# Patient Record
Sex: Female | Born: 1995 | Race: White | Hispanic: No | Smoking: Current some day smoker
Health system: Southern US, Community
[De-identification: ages and names within clinical notes are randomized; demographics above are authoritative.]

## PROBLEM LIST (undated history)

## (undated) ENCOUNTER — Inpatient Hospital Stay: Payer: Self-pay

## (undated) DIAGNOSIS — F419 Anxiety disorder, unspecified: Secondary | ICD-10-CM

## (undated) DIAGNOSIS — K8020 Calculus of gallbladder without cholecystitis without obstruction: Secondary | ICD-10-CM

## (undated) DIAGNOSIS — F32A Depression, unspecified: Secondary | ICD-10-CM

## (undated) HISTORY — PX: INDUCED ABORTION: SHX677

## (undated) HISTORY — PX: ABDOMINAL SURGERY: SHX537

---
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
## 2014-03-18 ENCOUNTER — Ambulatory Visit (HOSPITAL_COMMUNITY): Payer: Self-pay | Admitting: Psychology

## 2015-02-24 HISTORY — PX: INDUCED ABORTION: SHX677

## 2015-08-13 ENCOUNTER — Emergency Department
Admission: EM | Admit: 2015-08-13 | Discharge: 2015-08-13 | Disposition: A | Discharge status: Home / Self Care | Payer: BLUE CROSS/BLUE SHIELD | Attending: Emergency Medicine | Admitting: Emergency Medicine

## 2015-08-13 ENCOUNTER — Emergency Department: Payer: BLUE CROSS/BLUE SHIELD

## 2015-08-13 DIAGNOSIS — O9989 Other specified diseases and conditions complicating pregnancy, childbirth and the puerperium: Secondary | ICD-10-CM | POA: Diagnosis not present

## 2015-08-13 DIAGNOSIS — R102 Pelvic and perineal pain: Secondary | ICD-10-CM | POA: Insufficient documentation

## 2015-08-13 DIAGNOSIS — R350 Frequency of micturition: Secondary | ICD-10-CM | POA: Diagnosis not present

## 2015-08-13 DIAGNOSIS — F17210 Nicotine dependence, cigarettes, uncomplicated: Secondary | ICD-10-CM | POA: Diagnosis not present

## 2015-08-13 DIAGNOSIS — K8020 Calculus of gallbladder without cholecystitis without obstruction: Secondary | ICD-10-CM | POA: Insufficient documentation

## 2015-08-13 DIAGNOSIS — O99331 Smoking (tobacco) complicating pregnancy, first trimester: Secondary | ICD-10-CM | POA: Diagnosis not present

## 2015-08-13 DIAGNOSIS — Z3A01 Less than 8 weeks gestation of pregnancy: Secondary | ICD-10-CM | POA: Diagnosis not present

## 2015-08-13 DIAGNOSIS — Z3490 Encounter for supervision of normal pregnancy, unspecified, unspecified trimester: Secondary | ICD-10-CM

## 2015-08-13 HISTORY — DX: Calculus of gallbladder without cholecystitis without obstruction: K80.20

## 2015-08-13 LAB — POCT PREGNANCY, URINE: Preg Test, Ur: POSITIVE — AB

## 2015-08-13 LAB — URINALYSIS COMPLETE WITH MICROSCOPIC (ARMC ONLY)
Bilirubin Urine: NEGATIVE
Glucose, UA: NEGATIVE mg/dL
Hgb urine dipstick: NEGATIVE
Nitrite: NEGATIVE
Protein, ur: NEGATIVE mg/dL
Specific Gravity, Urine: 1.028 (ref 1.005–1.030)
pH: 5 (ref 5.0–8.0)

## 2015-08-13 LAB — CHLAMYDIA/NGC RT PCR (ARMC ONLY)
Chlamydia Tr: NOT DETECTED
N gonorrhoeae: NOT DETECTED

## 2015-08-13 LAB — WET PREP, GENITAL
Clue Cells Wet Prep HPF POC: NONE SEEN
Trich, Wet Prep: NONE SEEN

## 2015-08-13 LAB — HCG, QUANTITATIVE, PREGNANCY: hCG, Beta Chain, Quant, S: 11746 m[IU]/mL — ABNORMAL HIGH (ref <5)

## 2015-08-13 NOTE — ED Notes (Signed)
Patient taken to ultrasound.

## 2015-08-13 NOTE — ED Notes (Signed)
Pt found out 1week ago that she is pregnant by test at home, she had an abortion in march and states she was supposed to have follow up D&C "to check" that there was no retained products.  Pt never had follow up and is no concerned..  She states "I am having symptoms that are not normal" but she is not able to give specific complaints.  States has had panic attack today and is anxious in triage.

## 2015-08-13 NOTE — ED Provider Notes (Signed)
[facility] Emergency Department Provider Note  ____________________________________________  Time seen: 5:30 AM  I have reviewed the triage vital signs and the nursing notes.   HISTORY  Chief Complaint Possible Pregnancy      HPI Anne Rogers is a 19 y.o. female G1 para 0 with 1 elective abortion March 2016 presents with history of positive home pregnancy test approximately 1-2 weeks ago. Patient statesthat she's had multiple symptoms which concern her namely breast tenderness, eating a lot, pelvic discomfort, urinary frequency. Last menstrual period July 6     Past medical history Elective abortion March 2016 Patient Active Problem List   Diagnosis Date Noted  . Gall stones     Surgical history None Current Outpatient Rx  Name  Route  Sig  Dispense  Refill  . aspirin-acetaminophen-caffeine (EXCEDRIN MIGRAINE) 250-250-65 MG per tablet   Oral   Take by mouth every 6 (six) hours as needed for headache.         Marland Kitchen aspirin-sod bicarb-citric acid (ALKA-SELTZER) 325 MG TBEF tablet   Oral   Take 325 mg by mouth every 6 (six) hours as needed.           Allergies No known drug allergies History reviewed. No pertinent family history.  Social History Social History  Substance Use Topics  . Smoking status: Current Every Day Smoker -- 0.50 packs/day for 1.5 years    Types: Cigarettes  . Smokeless tobacco: None  . Alcohol Use: No    Review of Systems  Constitutional: Negative for fever. Eyes: Negative for visual changes. ENT: Negative for sore throat. Cardiovascular: Negative for chest pain. Respiratory: Negative for shortness of breath. Gastrointestinal: Negative for abdominal pain, vomiting and diarrhea. Genitourinary: Negative for dysuria. Musculoskeletal: Negative for back pain. Skin: Negative for rash. Neurological: Negative for headaches, focal weakness or numbness.   10-point ROS otherwise  negative.  ____________________________________________   PHYSICAL EXAM:  VITAL SIGNS: ED Triage Vitals  Enc Vitals Group     BP 08/13/15 0459 138/62 mmHg     Pulse Rate 08/13/15 0459 68     Resp 08/13/15 0459 18     Temp 08/13/15 0459 98.3 F (36.8 C)     Temp Source 08/13/15 0459 Oral     SpO2 08/13/15 0459 100 %     Weight 08/13/15 0459 240 lb (108.863 kg)     Height 08/13/15 0459 5\' 3"  (1.6 m)     Head Cir --      Peak Flow --      Pain Score 08/13/15 0500 0     Pain Loc --      Pain Edu? --      Excl. in GC? --      Constitutional: Alert and oriented. Well appearing and in no distress. Eyes: Conjunctivae are normal. PERRL. Normal extraocular movements. ENT   Head: Normocephalic and atraumatic.   Nose: No congestion/rhinnorhea.   Mouth/Throat: Mucous membranes are moist.   Neck: No stridor. Hematological/Lymphatic/Immunilogical: No cervical lymphadenopathy. Cardiovascular: Normal rate, regular rhythm. Normal and symmetric distal pulses are present in all extremities. No murmurs, rubs, or gallops. Respiratory: Normal respiratory effort without tachypnea nor retractions. Breath sounds are clear and equal bilaterally. No wheezes/rales/rhonchi. Gastrointestinal: Soft and nontender. No distention. There is no CVA tenderness. Genitourinary: Scant vaginal discharge no cervical motion tenderness Musculoskeletal: Nontender with normal range of motion in all extremities. No joint effusions.  No lower extremity tenderness nor edema. Neurologic:  Normal speech and language. No gross  focal neurologic deficits are appreciated. Speech is normal.  Skin:  Skin is warm, dry and intact. No rash noted. Psychiatric: Mood and affect are normal. Speech and behavior are normal. Patient exhibits appropriate insight and judgment.  ____________________________________________    LABS (pertinent positives/negatives)  Labs Reviewed  WET PREP, GENITAL - Abnormal; Notable for the  following:    Yeast Wet Prep HPF POC FEW (*)    WBC, Wet Prep HPF POC MANY (*)    All other components within normal limits  HCG, QUANTITATIVE, PREGNANCY - Abnormal; Notable for the following:    hCG, Beta Chain, Quant, S 11746 (*)    All other components within normal limits  URINALYSIS COMPLETEWITH MICROSCOPIC ([facility] ONLY) - Abnormal; Notable for the following:    Color, Urine YELLOW (*)    APPearance CLOUDY (*)    Ketones, ur TRACE (*)    Leukocytes, UA 3+ (*)    Bacteria, UA RARE (*)    Squamous Epithelial / LPF TOO NUMEROUS TO COUNT (*)    All other components within normal limits  POCT PREGNANCY, URINE - Abnormal; Notable for the following:    Preg Test, Ur POSITIVE (*)    All other components within normal limits  CHLAMYDIA/NGC RT PCR ([facility] ONLY)       RADIOLOGY     INITIAL IMPRESSION / ASSESSMENT AND PLAN / ED COURSE  Pertinent labs & imaging results that were available during my care of the patient were reviewed by me and considered in my medical decision making (see chart for details).    ____________________________________________   FINAL CLINICAL IMPRESSION(S) / ED DIAGNOSES  Final diagnoses:  Pregnancy      Darci Current, MD 08/14/15 [phone]

## 2015-08-13 NOTE — Discharge Instructions (Signed)
First Trimester of Pregnancy The first trimester of pregnancy is from week 1 until the end of week 12 (months 1 through 3). A week after a sperm fertilizes an egg, the egg will implant on the wall of the uterus. This embryo will begin to develop into a baby. Genes from you and your partner are forming the baby. The female genes determine whether the baby is a boy or a girl. At 6-8 weeks, the eyes and face are formed, and the heartbeat can be seen on ultrasound. At the end of 12 weeks, all the baby's organs are formed.  Now that you are pregnant, you will want to do everything you can to have a healthy baby. Two of the most important things are to get good prenatal care and to follow your health care provider's instructions. Prenatal care is all the medical care you receive before the baby's birth. This care will help prevent, find, and treat any problems during the pregnancy and childbirth. BODY CHANGES Your body goes through many changes during pregnancy. The changes vary from woman to woman.   You may gain or lose a couple of pounds at first.  You may feel sick to your stomach (nauseous) and throw up (vomit). If the vomiting is uncontrollable, call your health care provider.  You may tire easily.  You may develop headaches that can be relieved by medicines approved by your health care provider.  You may urinate more often. Painful urination may mean you have a bladder infection.  You may develop heartburn as a result of your pregnancy.  You may develop constipation because certain hormones are causing the muscles that push waste through your intestines to slow down.  You may develop hemorrhoids or swollen, bulging veins (varicose veins).  Your breasts may begin to grow larger and become tender. Your nipples may stick out more, and the tissue that surrounds them (areola) may become darker.  Your gums may bleed and may be sensitive to brushing and flossing.  Dark spots or blotches (chloasma,  mask of pregnancy) may develop on your face. This will likely fade after the baby is born.  Your menstrual periods will stop.  You may have a loss of appetite.  You may develop cravings for certain kinds of food.  You may have changes in your emotions from day to day, such as being excited to be pregnant or being concerned that something may go wrong with the pregnancy and baby.  You may have more vivid and strange dreams.  You may have changes in your hair. These can include thickening of your hair, rapid growth, and changes in texture. Some women also have hair loss during or after pregnancy, or hair that feels dry or thin. Your hair will most likely return to normal after your baby is born. WHAT TO EXPECT AT YOUR PRENATAL VISITS During a routine prenatal visit:  You will be weighed to make sure you and the baby are growing normally.  Your blood pressure will be taken.  Your abdomen will be measured to track your baby's growth.  The fetal heartbeat will be listened to starting around week 10 or 12 of your pregnancy.  Test results from any previous visits will be discussed. Your health care provider may ask you:  How you are feeling.  If you are feeling the baby move.  If you have had any abnormal symptoms, such as leaking fluid, bleeding, severe headaches, or abdominal cramping.  If you have any questions. Other tests   that may be performed during your first trimester include:  Blood tests to find your blood type and to check for the presence of any previous infections. They will also be used to check for low iron levels (anemia) and Rh antibodies. Later in the pregnancy, blood tests for diabetes will be done along with other tests if problems develop.  Urine tests to check for infections, diabetes, or protein in the urine.  An ultrasound to confirm the proper growth and development of the baby.  An amniocentesis to check for possible genetic problems.  Fetal screens for  spina bifida and Down syndrome.  You may need other tests to make sure you and the baby are doing well. HOME CARE INSTRUCTIONS  Medicines  Follow your health care provider's instructions regarding medicine use. Specific medicines may be either safe or unsafe to take during pregnancy.  Take your prenatal vitamins as directed.  If you develop constipation, try taking a stool softener if your health care provider approves. Diet  Eat regular, well-balanced meals. Choose a variety of foods, such as meat or vegetable-based protein, fish, milk and low-fat dairy products, vegetables, fruits, and whole grain breads and cereals. Your health care provider will help you determine the amount of weight gain that is right for you.  Avoid raw meat and uncooked cheese. These carry germs that can cause birth defects in the baby.  Eating four or five small meals rather than three large meals a day may help relieve nausea and vomiting. If you start to feel nauseous, eating a few soda crackers can be helpful. Drinking liquids between meals instead of during meals also seems to help nausea and vomiting.  If you develop constipation, eat more high-fiber foods, such as fresh vegetables or fruit and whole grains. Drink enough fluids to keep your urine clear or pale yellow. Activity and Exercise  Exercise only as directed by your health care provider. Exercising will help you:  Control your weight.  Stay in shape.  Be prepared for labor and delivery.  Experiencing pain or cramping in the lower abdomen or low back is a good sign that you should stop exercising. Check with your health care provider before continuing normal exercises.  Try to avoid standing for long periods of time. Move your legs often if you must stand in one place for a long time.  Avoid heavy lifting.  Wear low-heeled shoes, and practice good posture.  You may continue to have sex unless your health care provider directs you  otherwise. Relief of Pain or Discomfort  Wear a good support bra for breast tenderness.   Take warm sitz baths to soothe any pain or discomfort caused by hemorrhoids. Use hemorrhoid cream if your health care provider approves.   Rest with your legs elevated if you have leg cramps or low back pain.  If you develop varicose veins in your legs, wear support hose. Elevate your feet for 15 minutes, 3-4 times a day. Limit salt in your diet. Prenatal Care  Schedule your prenatal visits by the twelfth week of pregnancy. They are usually scheduled monthly at first, then more often in the last 2 months before delivery.  Write down your questions. Take them to your prenatal visits.  Keep all your prenatal visits as directed by your health care provider. Safety  Wear your seat belt at all times when driving.  Make a list of emergency phone numbers, including numbers for family, friends, the hospital, and police and fire departments. General Tips    Ask your health care provider for a referral to a local prenatal education class. Begin classes no later than at the beginning of month 6 of your pregnancy.  Ask for help if you have counseling or nutritional needs during pregnancy. Your health care provider can offer advice or refer you to specialists for help with various needs.  Do not use hot tubs, steam rooms, or saunas.  Do not douche or use tampons or scented sanitary pads.  Do not cross your legs for long periods of time.  Avoid cat litter boxes and soil used by cats. These carry germs that can cause birth defects in the baby and possibly loss of the fetus by miscarriage or stillbirth.  Avoid all smoking, herbs, alcohol, and medicines not prescribed by your health care provider. Chemicals in these affect the formation and growth of the baby.  Schedule a dentist appointment. At home, brush your teeth with a soft toothbrush and be gentle when you floss. SEEK MEDICAL CARE IF:   You have  dizziness.  You have mild pelvic cramps, pelvic pressure, or nagging pain in the abdominal area.  You have persistent nausea, vomiting, or diarrhea.  You have a bad smelling vaginal discharge.  You have pain with urination.  You notice increased swelling in your face, hands, legs, or ankles. SEEK IMMEDIATE MEDICAL CARE IF:   You have a fever.  You are leaking fluid from your vagina.  You have spotting or bleeding from your vagina.  You have severe abdominal cramping or pain.  You have rapid weight gain or loss.  You vomit blood or material that looks like coffee grounds.  You are exposed to German measles and have never had them.  You are exposed to fifth disease or chickenpox.  You develop a severe headache.  You have shortness of breath.  You have any kind of trauma, such as from a fall or a car accident. Document Released: 12/06/2001 Document Revised: 04/28/2014 Document Reviewed: 10/22/2013 Exit[employer] Patient Information 2015 [employer]. This information is not intended to replace advice given to you by your health care provider. Make sure you discuss any questions you have with your health care provider.  

## 2015-11-02 LAB — OB RESULTS CONSOLE RUBELLA ANTIBODY, IGM: Rubella: IMMUNE

## 2015-11-02 LAB — OB RESULTS CONSOLE RPR: RPR: NONREACTIVE

## 2015-11-02 LAB — OB RESULTS CONSOLE VARICELLA ZOSTER ANTIBODY, IGG: Varicella: NON-IMMUNE/NOT IMMUNE

## 2015-11-02 LAB — OB RESULTS CONSOLE HIV ANTIBODY (ROUTINE TESTING): HIV: NONREACTIVE

## 2015-11-02 LAB — OB RESULTS CONSOLE HEPATITIS B SURFACE ANTIGEN: Hepatitis B Surface Ag: NEGATIVE

## 2015-12-03 ENCOUNTER — Inpatient Hospital Stay
Admission: EM | Admit: 2015-12-03 | Discharge: 2015-12-03 | Disposition: A | Discharge status: Home / Self Care | Payer: BLUE CROSS/BLUE SHIELD | Admitting: Obstetrics & Gynecology

## 2015-12-03 ENCOUNTER — Emergency Department
Admission: EM | Admit: 2015-12-03 | Discharge: 2015-12-04 | Disposition: A | Discharge status: Home / Self Care | Payer: Medicaid Other | Attending: Emergency Medicine | Admitting: Emergency Medicine

## 2015-12-03 ENCOUNTER — Encounter: Payer: Self-pay | Admitting: Emergency Medicine

## 2015-12-03 ENCOUNTER — Emergency Department: Payer: Medicaid Other

## 2015-12-03 DIAGNOSIS — Z3A23 23 weeks gestation of pregnancy: Secondary | ICD-10-CM | POA: Insufficient documentation

## 2015-12-03 DIAGNOSIS — O99332 Smoking (tobacco) complicating pregnancy, second trimester: Secondary | ICD-10-CM | POA: Insufficient documentation

## 2015-12-03 DIAGNOSIS — Z79899 Other long term (current) drug therapy: Secondary | ICD-10-CM | POA: Diagnosis not present

## 2015-12-03 DIAGNOSIS — O99612 Diseases of the digestive system complicating pregnancy, second trimester: Secondary | ICD-10-CM | POA: Diagnosis not present

## 2015-12-03 DIAGNOSIS — R1011 Right upper quadrant pain: Secondary | ICD-10-CM

## 2015-12-03 DIAGNOSIS — O9989 Other specified diseases and conditions complicating pregnancy, childbirth and the puerperium: Secondary | ICD-10-CM | POA: Diagnosis present

## 2015-12-03 DIAGNOSIS — F17210 Nicotine dependence, cigarettes, uncomplicated: Secondary | ICD-10-CM | POA: Diagnosis not present

## 2015-12-03 DIAGNOSIS — K8020 Calculus of gallbladder without cholecystitis without obstruction: Secondary | ICD-10-CM | POA: Diagnosis not present

## 2015-12-03 LAB — CBC WITH DIFFERENTIAL/PLATELET
Basophils Absolute: 0.1 10*3/uL (ref 0–0.1)
Basophils Relative: 1 %
Eosinophils Absolute: 0.2 10*3/uL (ref 0–0.7)
Eosinophils Relative: 2 %
HCT: 37.3 % (ref 35.0–47.0)
Hemoglobin: 12.8 g/dL (ref 12.0–16.0)
Lymphocytes Relative: 24 %
Lymphs Abs: 2.6 10*3/uL (ref 1.0–3.6)
MCH: 30.7 pg (ref 26.0–34.0)
MCHC: 34.4 g/dL (ref 32.0–36.0)
MCV: 89.2 fL (ref 80.0–100.0)
Monocytes Absolute: 0.8 10*3/uL (ref 0.2–0.9)
Monocytes Relative: 8 %
Neutro Abs: 7.1 10*3/uL — ABNORMAL HIGH (ref 1.4–6.5)
Neutrophils Relative %: 65 %
Platelets: 261 10*3/uL (ref 150–440)
RBC: 4.19 MIL/uL (ref 3.80–5.20)
RDW: 13.2 % (ref 11.5–14.5)
WBC: 10.8 10*3/uL (ref 3.6–11.0)

## 2015-12-03 LAB — COMPREHENSIVE METABOLIC PANEL
ALT: 18 U/L (ref 14–54)
AST: 14 U/L — ABNORMAL LOW (ref 15–41)
Albumin: 3.5 g/dL (ref 3.5–5.0)
Alkaline Phosphatase: 41 U/L (ref 38–126)
Anion gap: 6 (ref 5–15)
BUN: 7 mg/dL (ref 6–20)
CO2: 19 mmol/L — ABNORMAL LOW (ref 22–32)
Calcium: 9.3 mg/dL (ref 8.9–10.3)
Chloride: 110 mmol/L (ref 101–111)
Creatinine, Ser: 0.42 mg/dL — ABNORMAL LOW (ref 0.44–1.00)
GFR calc Af Amer: >60 mL/min (ref >60)
GFR calc non Af Amer: >60 mL/min (ref >60)
Glucose, Bld: 91 mg/dL (ref 65–99)
Potassium: 3.7 mmol/L (ref 3.5–5.1)
Sodium: 135 mmol/L (ref 135–145)
Total Bilirubin: 0.3 mg/dL (ref 0.3–1.2)
Total Protein: 6.9 g/dL (ref 6.5–8.1)

## 2015-12-03 LAB — URINALYSIS COMPLETE WITH MICROSCOPIC (ARMC ONLY)
Bilirubin Urine: NEGATIVE
Glucose, UA: NEGATIVE mg/dL
Hgb urine dipstick: NEGATIVE
Leukocytes, UA: NEGATIVE
Nitrite: NEGATIVE
Protein, ur: NEGATIVE mg/dL
Specific Gravity, Urine: 1.018 (ref 1.005–1.030)
pH: 5 (ref 5.0–8.0)

## 2015-12-03 LAB — LIPASE, BLOOD: Lipase: 35 U/L (ref 11–51)

## 2015-12-03 MED ORDER — PROMETHAZINE HCL 25 MG/ML IJ SOLN
12.5000 mg | Freq: Once | INTRAMUSCULAR | Status: AC
  Administered 2015-12-03: 12.5 mg via INTRAVENOUS
  Filled 2015-12-03: qty 1

## 2015-12-03 MED ORDER — SODIUM CHLORIDE 0.9 % IV BOLUS (SEPSIS)
1000.0000 mL | Freq: Once | INTRAVENOUS | Status: AC
  Administered 2015-12-03: 1000 mL via INTRAVENOUS

## 2015-12-03 NOTE — ED Notes (Signed)
Pt to ER with c/o upper abdominal pain with n/v.  Pt is [redacted] weeks pregnant, was cleared by L&D prior to returning to ER.

## 2015-12-03 NOTE — ED Notes (Addendum)
Pt reports RUQ pain x 6 hours, w/ nausea, denies diarrhea.  Pt reports hx gall stones, last flare up 3 years ago.  Pt reports being [redacted] weeks pregnant, already cleared by L&D, fetal HR 138 per L&D note.  PT NAD at this.

## 2015-12-03 NOTE — Progress Notes (Signed)
Pt transferred via w/c to ER for treatment in stable condition.

## 2015-12-03 NOTE — Progress Notes (Signed)
Pt brought to L&D with complaints of abdominal pain.   Upon entering unit it was noted that she has gallstones.  Denies any problem with pregnancy.  +FM, NO VB or ROM.   It was discussed with Dr. Elesa MassedWard and she would like patient to be evaluated by ER.     FHR by doppler 138.

## 2015-12-04 NOTE — ED Provider Notes (Signed)
[facility] Emergency Department Provider Note  ____________________________________________  Time seen: 11:00 PM  I have reviewed the triage vital signs and the nursing notes.   HISTORY  Chief Complaint Abdominal Pain     HPI Anne Rogers is a 19 y.o. female presents with right upper quadrant pain 6 hours accompanied by nausea. Patient states she has a history of gallstones which she was diagnosed in her teens last flareup was approximately 3 years ago. Of note patient is [redacted] weeks pregnant she is a G2 P1 that has R he been cleared by labor and delivery.    Past Medical History  Diagnosis Date  . Gall stones     Patient Active Problem List   Diagnosis Date Noted  . Gall stones     Past Surgical History  Procedure Laterality Date  . Induced abortion  02/2015  . Abdominal surgery      Current Outpatient Rx  Name  Route  Sig  Dispense  Refill  . Prenat-FeFum-FePo-FA-DHA w/o A (PROVIDA DHA) 16-16-1.25-110 MG CAPS   Oral   Take 1 capsule by mouth daily at 12 noon.         Marland Kitchen aspirin-acetaminophen-caffeine (EXCEDRIN MIGRAINE) 250-250-65 MG per tablet   Oral   Take by mouth every 6 (six) hours as needed for headache.         Marland Kitchen aspirin-sod bicarb-citric acid (ALKA-SELTZER) 325 MG TBEF tablet   Oral   Take 325 mg by mouth every 6 (six) hours as needed.           Allergies No known drug allergies History reviewed. No pertinent family history.  Social History Social History  Substance Use Topics  . Smoking status: Current Every Day Smoker -- 0.50 packs/day for 1.5 years    Types: Cigarettes  . Smokeless tobacco: None  . Alcohol Use: No    Review of Systems  Constitutional: Negative for fever. Eyes: Negative for visual changes. ENT: Negative for sore throat. Cardiovascular: Negative for chest pain. Respiratory: Negative for shortness of breath. Gastrointestinal: Positive right upper quadrant pain and nausea Genitourinary:  Negative for dysuria. Musculoskeletal: Negative for back pain. Skin: Negative for rash. Neurological: Negative for headaches, focal weakness or numbness.   10-point ROS otherwise negative.  ____________________________________________   PHYSICAL EXAM:  VITAL SIGNS: ED Triage Vitals  Enc Vitals Group     BP 12/03/15 2110 113/56 mmHg     Pulse Rate 12/03/15 2110 80     Resp 12/03/15 2110 18     Temp 12/03/15 2110 98.1 F (36.7 C)     Temp src --      SpO2 12/03/15 2110 99 %     Weight 12/03/15 2110 245 lb (111.131 kg)     Height 12/03/15 2110  (1.6 m)     Head Cir --      Peak Flow --      Pain Score 12/03/15 2111 5     Pain Loc --      Pain Edu? --      Excl. in GC? --      Constitutional: Alert and oriented. Well appearing and in no distress. Eyes: Conjunctivae are normal. PERRL. Normal extraocular movements. ENT   Head: Normocephalic and atraumatic.   Nose: No congestion/rhinnorhea.   Mouth/Throat: Mucous membranes are moist.   Neck: No stridor. Hematological/Lymphatic/Immunilogical: No cervical lymphadenopathy. Cardiovascular: Normal rate, regular rhythm. Normal and symmetric distal pulses are present in all extremities. No murmurs, rubs, or gallops.  Respiratory: Normal respiratory effort without tachypnea nor retractions. Breath sounds are clear and equal bilaterally. No wheezes/rales/rhonchi. Gastrointestinal: Right upper quadrant pain to palpation. No distention. There is no CVA tenderness. Genitourinary: deferred Musculoskeletal: Nontender with normal range of motion in all extremities. No joint effusions.  No lower extremity tenderness nor edema. Neurologic:  Normal speech and language. No gross focal neurologic deficits are appreciated. Speech is normal.  Skin:  Skin is warm, dry and intact. No rash noted. Psychiatric: Mood and affect are normal. Speech and behavior are normal. Patient exhibits appropriate insight and  judgment.  ____________________________________________    LABS (pertinent positives/negatives)  Labs Reviewed  CBC WITH DIFFERENTIAL/PLATELET - Abnormal; Notable for the following:    Neutro Abs 7.1 (*)    All other components within normal limits  COMPREHENSIVE METABOLIC PANEL - Abnormal; Notable for the following:    CO2 19 (*)    Creatinine, Ser 0.42 (*)    AST 14 (*)    All other components within normal limits  URINALYSIS COMPLETEWITH MICROSCOPIC ([facility] ONLY) - Abnormal; Notable for the following:    Color, Urine YELLOW (*)    APPearance CLEAR (*)    Ketones, ur TRACE (*)    Bacteria, UA RARE (*)    Squamous Epithelial / LPF 0-5 (*)    All other components within normal limits  LIPASE, BLOOD       RADIOLOGY  US Abdomen Limited RUQ (Final result) Result time: 12/04/15 01:12:41   Procedure changed from US Abdomen Limited      Final result by Rad Results In Interface (12/04/15 01:12:41)   Narrative:   CLINICAL DATA: Acute onset of right upper quadrant abdominal pain for several hours. Initial encounter.  EXAM: US ABDOMEN LIMITED - RIGHT UPPER QUADRANT  COMPARISON: Right upper quadrant ultrasound performed 09/26/2012  FINDINGS: Gallbladder:  A number of stones are noted dependently within the gallbladder, measuring up to 1.2 cm in size. No gallbladder wall thickening or pericholecystic fluid is seen. No ultrasonographic Murphy's sign is elicited.  Common bile duct:  Diameter: 0.3 cm, within normal limits in caliber.  Liver:  No focal lesion identified. Within normal limits in parenchymal echogenicity.  IMPRESSION: Cholelithiasis. Gallbladder otherwise unremarkable. No evidence for obstruction or cholecystitis.   Electronically Signed By: Roanna RaiderJeffery Chang M.D. On: 12/04/2015 01:12          INITIAL IMPRESSION / ASSESSMENT AND PLAN / ED COURSE  Pertinent labs & imaging results that were available during my care of the patient were  reviewed by me and considered in my medical decision making (see chart for details).  Patient received IV Phenergan in the emergency department with resolution of symptoms. Patient will be referred to Dr. Excell Seltzerooper for outpatient evaluation and management of cholelithiasis  ____________________________________________   FINAL CLINICAL IMPRESSION(S) / ED DIAGNOSES  Final diagnoses:  Gallstones      Darci Currentandolph N Sheneika Walstad, MD 12/04/15 [phone]

## 2015-12-04 NOTE — Discharge Instructions (Signed)

## 2015-12-04 NOTE — ED Notes (Signed)
Pt. Going home with family, will call surgery today.

## 2015-12-27 NOTE — L&D Delivery Note (Signed)
Delivery Note At 10:59 AM a vigorous female infant was delivered via Vaginal, Spontaneous Delivery (Presentation: left Occiput Anterior) with left nuchal arm and loose nuchal cord x1. Baby delivered through nuchal cord.No meconium seen but cord is green tinted. APGAR: 9, 9; weight 6 lb 12 oz (3062 g).   Placenta status: Intact, Spontaneous with 3 vessel cord  Anesthesia: Epidural  Episiotomy: None Lacerations: 2nd degree;Perineal Suture Repair: 3.0 chromic Est. Blood Loss (mL): 400  Mom to postpartum.  Baby to Couplet care / Skin to Skin.  Tashala Cumbo 04/12/2016, 11:41 AM

## 2016-03-16 LAB — OB RESULTS CONSOLE GC/CHLAMYDIA
Chlamydia: NEGATIVE
Gonorrhea: NEGATIVE

## 2016-03-16 LAB — OB RESULTS CONSOLE GBS: GBS: NEGATIVE

## 2016-04-09 ENCOUNTER — Encounter: Payer: Self-pay | Admitting: *Deleted

## 2016-04-09 ENCOUNTER — Observation Stay
Admission: EM | Admit: 2016-04-09 | Discharge: 2016-04-09 | Disposition: A | Discharge status: Home / Self Care | Payer: Medicaid Other | Attending: Obstetrics and Gynecology | Admitting: Obstetrics and Gynecology

## 2016-04-09 DIAGNOSIS — Z3A40 40 weeks gestation of pregnancy: Secondary | ICD-10-CM | POA: Insufficient documentation

## 2016-04-09 DIAGNOSIS — O471 False labor at or after 37 completed weeks of gestation: Principal | ICD-10-CM | POA: Insufficient documentation

## 2016-04-09 DIAGNOSIS — F17210 Nicotine dependence, cigarettes, uncomplicated: Secondary | ICD-10-CM | POA: Insufficient documentation

## 2016-04-09 DIAGNOSIS — O99333 Smoking (tobacco) complicating pregnancy, third trimester: Secondary | ICD-10-CM | POA: Diagnosis not present

## 2016-04-09 NOTE — Final Progress Note (Signed)
Physician Final Progress Note  Patient ID: Anne Rogers MRN: [id number] DOB/AGE: 22/01/1996 21 y.o.  Admit date: 04/09/2016 Admitting provider: Conard NovakStephen D Joniece Smotherman, MD Discharge date: 04/09/2016   Admission Diagnoses: contractions  Discharge Diagnoses:  Active Problems:   * No active hospital problems. *  contractions  History of Present Illness: The patient is a 20 y.o. female G3P0010 at 5337w1d who presents for evaluation of contractions.  She has been having contractions since late yesterday evening. She notes +FM, no LOF, no VB.   Past Medical History  Diagnosis Date  . Gall stones     Past Surgical History  Procedure Laterality Date  . Induced abortion  02/2015  . Abdominal surgery      No current facility-administered medications on file prior to encounter.   Current Outpatient Prescriptions on File Prior to Encounter  Medication Sig Dispense Refill  . aspirin-acetaminophen-caffeine (EXCEDRIN MIGRAINE) 250-250-65 MG per tablet Take by mouth every 6 (six) hours as needed for headache.    Marland Kitchen. aspirin-sod bicarb-citric acid (ALKA-SELTZER) 325 MG TBEF tablet Take 325 mg by mouth every 6 (six) hours as needed.    . Prenat-FeFum-FePo-FA-DHA w/o A (PROVIDA DHA) 16-16-1.25-110 MG CAPS Take 1 capsule by mouth daily at 12 noon.      No Known Allergies  Social History   Social History  . Marital Status: Single    Spouse Name: N/A  . Number of Children: N/A  . Years of Education: N/A   Occupational History  . Not on file.   Social History Main Topics  . Smoking status: Current Every Day Smoker -- 0.50 packs/day for 1.5 years    Types: Cigarettes  . Smokeless tobacco: Not on file  . Alcohol Use: No  . Drug Use: No  . Sexual Activity: Yes   Other Topics Concern  . Not on file   Social History Narrative    Physical Exam: LMP 07/01/2015  Gen: NAD Pelvic: 2cm, stable over 3 exams (4 hours)  Consults: None  Significant Findings/ Diagnostic Studies:  None  Procedures: NST, reactive  Discharge Condition: stable  Disposition: 01-Home or Self Care  Diet: Regular diet  Discharge Activity: Activity as tolerated     Medication List    STOP taking these medications        aspirin-sod bicarb-citric acid 325 MG Tbef tablet  Commonly known as:  ALKA-SELTZER      TAKE these medications        aspirin-acetaminophen-caffeine 250-250-65 MG tablet  Commonly known as:  EXCEDRIN MIGRAINE  Take by mouth every 6 (six) hours as needed for headache.     PROVIDA DHA 16-16-1.25-110 MG Caps  Take 1 capsule by mouth daily at 12 noon.         Total time spent taking care of this patient: 20 minutes  Signed: Conard NovakJackson, Timofey Carandang D, MD  04/09/2016, 5:13 AM

## 2016-04-09 NOTE — Discharge Summary (Signed)
Patient discharged home with instructions on follow up appointment, labor precautions, and when to seek medical attention. Patient ambulatory at discharge with no complaint. Husband with patient at discharge.

## 2016-04-11 ENCOUNTER — Inpatient Hospital Stay
Admission: EM | Admit: 2016-04-11 | Discharge: 2016-04-14 | DRG: 775 | Disposition: A | Discharge status: Home / Self Care | Payer: Medicaid Other | Attending: Obstetrics and Gynecology | Admitting: Obstetrics and Gynecology

## 2016-04-11 DIAGNOSIS — Z3A40 40 weeks gestation of pregnancy: Secondary | ICD-10-CM

## 2016-04-11 DIAGNOSIS — K8020 Calculus of gallbladder without cholecystitis without obstruction: Secondary | ICD-10-CM | POA: Diagnosis present

## 2016-04-11 DIAGNOSIS — O99214 Obesity complicating childbirth: Secondary | ICD-10-CM | POA: Diagnosis present

## 2016-04-11 DIAGNOSIS — Z6841 Body Mass Index (BMI) 40.0 and over, adult: Secondary | ICD-10-CM

## 2016-04-11 DIAGNOSIS — O2662 Liver and biliary tract disorders in childbirth: Secondary | ICD-10-CM | POA: Diagnosis present

## 2016-04-11 DIAGNOSIS — F17210 Nicotine dependence, cigarettes, uncomplicated: Secondary | ICD-10-CM | POA: Diagnosis present

## 2016-04-11 DIAGNOSIS — E669 Obesity, unspecified: Secondary | ICD-10-CM | POA: Diagnosis present

## 2016-04-11 DIAGNOSIS — O99334 Smoking (tobacco) complicating childbirth: Secondary | ICD-10-CM | POA: Diagnosis present

## 2016-04-11 LAB — CBC
HCT: 37.4 % (ref 35.0–47.0)
Hemoglobin: 13.2 g/dL (ref 12.0–16.0)
MCH: 31.6 pg (ref 26.0–34.0)
MCHC: 35.3 g/dL (ref 32.0–36.0)
MCV: 89.4 fL (ref 80.0–100.0)
Platelets: 302 10*3/uL (ref 150–440)
RBC: 4.18 MIL/uL (ref 3.80–5.20)
RDW: 13.5 % (ref 11.5–14.5)
WBC: 12.5 10*3/uL — ABNORMAL HIGH (ref 3.6–11.0)

## 2016-04-11 LAB — TYPE AND SCREEN
ABO/RH(D): A POS
Antibody Screen: NEGATIVE

## 2016-04-11 LAB — ABO/RH: ABO/RH(D): A POS

## 2016-04-11 MED ORDER — OXYTOCIN 40 UNITS IN LACTATED RINGERS INFUSION - SIMPLE MED
2.5000 [IU]/h | INTRAVENOUS | Status: DC
  Filled 2016-04-11: qty 1000

## 2016-04-11 MED ORDER — LIDOCAINE HCL (PF) 1 % IJ SOLN: 30.0000 mL | INTRAMUSCULAR | Status: DC | PRN

## 2016-04-11 MED ORDER — DINOPROSTONE 10 MG VA INST
10.0000 mg | VAGINAL_INSERT | Freq: Once | VAGINAL | Status: DC
  Filled 2016-04-11: qty 1

## 2016-04-11 MED ORDER — LACTATED RINGERS IV SOLN
INTRAVENOUS | Status: DC
  Administered 2016-04-11: 21:00:00 via INTRAVENOUS
  Administered 2016-04-12: 125 mL/h via INTRAVENOUS
  Administered 2016-04-12: 04:00:00 via INTRAVENOUS

## 2016-04-11 MED ORDER — ACETAMINOPHEN 325 MG PO TABS: 650.0000 mg | ORAL_TABLET | ORAL | Status: DC | PRN

## 2016-04-11 MED ORDER — OXYTOCIN 40 UNITS IN LACTATED RINGERS INFUSION - SIMPLE MED: 1.0000 m[IU]/min | INTRAVENOUS | Status: DC

## 2016-04-11 MED ORDER — ONDANSETRON HCL 4 MG/2ML IJ SOLN
4.0000 mg | Freq: Four times a day (QID) | INTRAMUSCULAR | Status: DC | PRN
  Administered 2016-04-12: 4 mg via INTRAVENOUS
  Filled 2016-04-11: qty 2

## 2016-04-11 MED ORDER — MISOPROSTOL 25 MCG QUARTER TABLET
25.0000 ug | ORAL_TABLET | ORAL | Status: DC
  Administered 2016-04-11 – 2016-04-12 (×2): 25 ug via VAGINAL
  Filled 2016-04-11: qty 1

## 2016-04-11 MED ORDER — OXYTOCIN BOLUS FROM INFUSION: 500.0000 mL | INTRAVENOUS | Status: DC

## 2016-04-11 MED ORDER — ZOLPIDEM TARTRATE 5 MG PO TABS
5.0000 mg | ORAL_TABLET | Freq: Once | ORAL | Status: AC
  Administered 2016-04-11: 5 mg via ORAL
  Filled 2016-04-11: qty 1

## 2016-04-11 MED ORDER — TERBUTALINE SULFATE 1 MG/ML IJ SOLN: 0.2500 mg | Freq: Once | INTRAMUSCULAR | Status: DC | PRN

## 2016-04-11 MED ORDER — LACTATED RINGERS IV SOLN
500.0000 mL | INTRAVENOUS | Status: DC | PRN
Start: 2016-04-11 — End: 2016-04-12

## 2016-04-11 MED ORDER — BUTORPHANOL TARTRATE 1 MG/ML IJ SOLN
1.0000 mg | INTRAMUSCULAR | Status: DC | PRN
Start: 2016-04-11 — End: 2016-04-12
  Administered 2016-04-12 (×4): 1 mg via INTRAVENOUS
  Filled 2016-04-11 (×4): qty 1

## 2016-04-11 NOTE — H&P (Signed)
OB History & Physical   History of Present Illness:  Chief Complaint: IOL for obesity BMI 41  HPI:  Anne Rogers is a 20 y.o. 723P0010 female at Unknown dated by U/S.  Her pregnancy has been complicated by Obesity BMI 41, late entry to prenatal care, gallstones.    She denies contractions.   She denies leakage of fluid.   She denies vaginal bleeding.   She reports fetal movement.    Maternal Medical History:   Past Medical History  Diagnosis Date  . Gall stones     Past Surgical History  Procedure Laterality Date  . Induced abortion  02/2015  . Abdominal surgery      No Known Allergies  Prior to Admission medications   Medication Sig Start Date End Date Taking? Authorizing Provider  aspirin-acetaminophen-caffeine (EXCEDRIN MIGRAINE) [phone] MG per tablet Take by mouth every 6 (six) hours as needed for headache. Reported on 04/11/2016    Historical Provider, MD  Prenat-FeFum-FePo-FA-DHA w/o A (PROVIDA DHA) 16-16-1.25-110 MG CAPS Take 1 capsule by mouth daily at 12 noon.    Historical Provider, MD    OB History  Gravida Para Term Preterm AB SAB TAB Ectopic Multiple Living  3    1         # Outcome Date GA Lbr Len/2nd Weight Sex Delivery Anes PTL Lv  3 Current           2 Gravida           1 AB               Prenatal care site: [facility]  Social History: She  reports that she has been smoking Cigarettes.  She has a .375 pack-year smoking history. She does not have any smokeless tobacco history on file. She reports that she does not drink alcohol or use illicit drugs.  Family History: family history is not on file.   Review of Systems: Negative x 10 systems reviewed except as noted in the HPI.    Physical Exam:  Vital Signs: BP 124/67 mmHg  Pulse 82  Temp(Src) 98.4 F (36.9 C) (Oral)  Resp 18  Ht 5\' 3"  (1.6 m)  Wt 108.41 kg (239 lb)  BMI 42.35 kg/m2  LMP 07/01/2015 General: no acute distress.  HEENT: normocephalic, atraumatic Heart: regular rate &  rhythm.  No murmurs/rubs/gallops Lungs: clear to auscultation bilaterally Abdomen: soft, gravid, non-tender;  EFW: 8 pounds Pelvic:   External: Normal external female genitalia  Cervix:  1 /  50 /  -3    Extremities: non-tender, symmetric, 1+ edema bilaterally.  DTRs: 2+ bilaterally Neurologic: Alert & oriented x 3.    Pertinent Results:  Prenatal Labs: Blood type/Rh A positive  Antibody screen negative  Rubella Immune  Varicella Not immune    RPR Non Reactive  HBsAg negative  HIV negative  GC negative  Chlamydia negative  Genetic screening AFP negative  1 hour GTT 111  3 hour GTT NA  GBS negative on 3/22   Baseline FHR: 125 beats/min   Variability: moderate   Accelerations: present   Decelerations: absent Contractions: present frequency: every 3-6 minutes Overall assessment: Category I    Assessment:  Anne Rogers is a 20 y.o. 423P0010 female at 2440 3/7 with IOL for obesity BMI 41.   Plan:  1. Admit to Labor & Delivery  2. CBC, T&S, Clrs, IVF 3. GBS negative.   4. Fetal well-being: Category I 5. IOL: cervical ripening with cervidil  overnight and continue with pitocin in the morning   Zema Lizardo, CNM  This patient and plan were discussed with Dr Vergie Living 04/11/2016

## 2016-04-12 ENCOUNTER — Inpatient Hospital Stay: Payer: Medicaid Other | Admitting: Registered Nurse

## 2016-04-12 ENCOUNTER — Encounter: Payer: Self-pay | Admitting: Certified Nurse Midwife

## 2016-04-12 LAB — CHLAMYDIA/NGC RT PCR (ARMC ONLY)
Chlamydia Tr: NOT DETECTED
N gonorrhoeae: NOT DETECTED

## 2016-04-12 MED ORDER — ONDANSETRON HCL 4 MG PO TABS
4.0000 mg | ORAL_TABLET | ORAL | Status: DC | PRN
Start: 2016-04-12 — End: 2016-04-14

## 2016-04-12 MED ORDER — PRENATAL MULTIVITAMIN CH
1.0000 | ORAL_TABLET | Freq: Every day | ORAL | Status: DC
  Administered 2016-04-12 – 2016-04-14 (×3): 1 via ORAL
  Filled 2016-04-12 (×3): qty 1

## 2016-04-12 MED ORDER — TETANUS-DIPHTH-ACELL PERTUSSIS 5-2.5-18.5 LF-MCG/0.5 IM SUSP: 0.5000 mL | Freq: Once | INTRAMUSCULAR | Status: DC

## 2016-04-12 MED ORDER — FERROUS SULFATE 325 (65 FE) MG PO TABS
325.0000 mg | ORAL_TABLET | Freq: Every day | ORAL | Status: DC
  Administered 2016-04-13 – 2016-04-14 (×2): 325 mg via ORAL
  Filled 2016-04-12 (×2): qty 1

## 2016-04-12 MED ORDER — OXYCODONE HCL 5 MG PO TABS
5.0000 mg | ORAL_TABLET | ORAL | Status: DC | PRN
  Administered 2016-04-12 – 2016-04-13 (×5): 5 mg via ORAL
  Filled 2016-04-12 (×5): qty 1

## 2016-04-12 MED ORDER — DOCUSATE SODIUM 100 MG PO CAPS
100.0000 mg | ORAL_CAPSULE | Freq: Every day | ORAL | Status: DC
  Administered 2016-04-12 – 2016-04-14 (×3): 100 mg via ORAL
  Filled 2016-04-12 (×3): qty 1

## 2016-04-12 MED ORDER — BUTORPHANOL TARTRATE 1 MG/ML IJ SOLN
2.0000 mg | Freq: Once | INTRAMUSCULAR | Status: AC | PRN
  Administered 2016-04-12: 2 mg via INTRAVENOUS
  Filled 2016-04-12: qty 2

## 2016-04-12 MED ORDER — BUPIVACAINE HCL (PF) 0.25 % IJ SOLN
INTRAMUSCULAR | Status: DC | PRN
  Administered 2016-04-12 (×2): 4 mL via EPIDURAL

## 2016-04-12 MED ORDER — DIBUCAINE 1 % RE OINT
1.0000 "application " | TOPICAL_OINTMENT | RECTAL | Status: DC | PRN
  Administered 2016-04-12: 1 via RECTAL
  Filled 2016-04-12: qty 28

## 2016-04-12 MED ORDER — SIMETHICONE 80 MG PO CHEW: 80.0000 mg | CHEWABLE_TABLET | ORAL | Status: DC | PRN

## 2016-04-12 MED ORDER — VARICELLA VIRUS VACCINE LIVE 1350 PFU/0.5ML IJ SUSR: 0.5000 mL | INTRAMUSCULAR | Status: DC | PRN

## 2016-04-12 MED ORDER — FENTANYL 2.5 MCG/ML W/ROPIVACAINE 0.2% IN NS 100 ML EPIDURAL INFUSION (ARMC-ANES)
EPIDURAL | Status: AC
  Administered 2016-04-12: 10 mL/h via EPIDURAL
  Filled 2016-04-12: qty 100

## 2016-04-12 MED ORDER — ONDANSETRON HCL 4 MG/2ML IJ SOLN: 4.0000 mg | INTRAMUSCULAR | Status: DC | PRN

## 2016-04-12 MED ORDER — MISOPROSTOL 25 MCG QUARTER TABLET
ORAL_TABLET | ORAL | Status: AC
  Administered 2016-04-12: 25 ug via VAGINAL
  Filled 2016-04-12: qty 0.25

## 2016-04-12 MED ORDER — MISOPROSTOL 25 MCG QUARTER TABLET
25.0000 ug | ORAL_TABLET | ORAL | Status: DC
  Filled 2016-04-12 (×5): qty 1

## 2016-04-12 MED ORDER — IBUPROFEN 600 MG PO TABS
600.0000 mg | ORAL_TABLET | Freq: Four times a day (QID) | ORAL | Status: DC | PRN
  Administered 2016-04-12 – 2016-04-14 (×5): 600 mg via ORAL
  Filled 2016-04-12 (×5): qty 1

## 2016-04-12 MED ORDER — LIDOCAINE HCL (PF) 1 % IJ SOLN
INTRAMUSCULAR | Status: DC | PRN
  Administered 2016-04-12: 3 mL

## 2016-04-12 MED ORDER — LIDOCAINE-EPINEPHRINE (PF) 1.5 %-1:200000 IJ SOLN
INTRAMUSCULAR | Status: DC | PRN
  Administered 2016-04-12: 3 mL via EPIDURAL

## 2016-04-12 MED ORDER — WITCH HAZEL-GLYCERIN EX PADS: 1.0000 "application " | MEDICATED_PAD | CUTANEOUS | Status: DC | PRN

## 2016-04-12 MED ORDER — COCONUT OIL OIL
1.0000 "application " | TOPICAL_OIL | Status: DC | PRN
  Filled 2016-04-12: qty 120

## 2016-04-12 MED ORDER — BENZOCAINE-MENTHOL 20-0.5 % EX AERO
1.0000 "application " | INHALATION_SPRAY | CUTANEOUS | Status: DC | PRN
  Administered 2016-04-12: 1 via TOPICAL
  Filled 2016-04-12: qty 56

## 2016-04-12 NOTE — Anesthesia Procedure Notes (Signed)
Epidural Patient location during procedure: OB Start time: 04/12/2016 8:59 AM End time: 04/12/2016 9:03 AM  Staffing Resident/CRNA: Stormy FabianURTIS, Tannor Pyon Performed by: resident/CRNA   Preanesthetic Checklist Completed: patient identified, site marked, surgical consent, pre-op evaluation, timeout performed, IV checked, risks and benefits discussed and monitors and equipment checked  Epidural Patient position: sitting Prep: Betadine Patient monitoring: heart rate, continuous pulse ox and blood pressure Approach: midline Location: L4-L5 Injection technique: LOR air  Needle:  Needle type: Tuohy  Needle gauge: 17 G Needle length: 9 cm and 9 Needle insertion depth: 8 cm Catheter type: closed end flexible Catheter size: 20 Guage Catheter at skin depth: 12 cm Test dose: negative and 1.5% lidocaine with Epi 1:200 K  Assessment Sensory level: T10 Events: blood not aspirated, injection not painful, no injection resistance, negative IV test and no paresthesia  Additional Notes Pt's history reviewed and consent obtained as per OB consent Patient tolerated the insertion well without complications. Negative SATD, negative IVTD All VSS were obtained and monitored through OBIX and nursing protocols followed.Reason for block:procedure for pain

## 2016-04-12 NOTE — Progress Notes (Signed)
L&D Progress Note  20 year old G1 P0 admitted at 7540 wk3 day for IOL due to obesity. Given 2 doses of Cytotec (last dose more than 5 hours ago. Has not slept thru the night due to contractions. Had 2 doses of Stadol, last at 0245. GBS negative.   S: Crying with contractions, breathing thru them O: BP 129/90 mmHg  Pulse 74  Temp(Src) 97.7 F (36.5 C) (Oral)  Resp 18  Ht 5\' 3"  (1.6 m)  Wt 108.41 kg (239 lb)  BMI 42.35 kg/m2  LMP 07/01/2015  FHR: 115-120 baseline with early decel to 100s, moderate variability Toco: not picking up real well when patient on her side, but appear to be q 3 min apart Cervix: 4/75-80%/ -1 to -2  A: Progressing Reassuring FWB P: Epidural for pain relief Consider Pitocin if not progressing  Anne Rogers, CNM

## 2016-04-12 NOTE — Anesthesia Preprocedure Evaluation (Addendum)
Anesthesia Evaluation  Patient identified by MRN, date of birth, ID band Patient awake    Reviewed: Allergy & Precautions, H&P , NPO status , Patient's Chart, lab work & pertinent test results  History of Anesthesia Complications Negative for: history of anesthetic complications  Airway Mallampati: II  TM Distance: >3 FB Neck ROM: full    Dental no notable dental hx.    Pulmonary Current Smoker,    Pulmonary exam normal        Cardiovascular negative cardio ROS Normal cardiovascular exam     Neuro/Psych negative neurological ROS  negative psych ROS   GI/Hepatic negative GI ROS, Neg liver ROS,   Endo/Other  negative endocrine ROS  Renal/GU negative Renal ROS  negative genitourinary   Musculoskeletal   Abdominal   Peds  Hematology negative hematology ROS (+)   Anesthesia Other Findings   Reproductive/Obstetrics (+) Pregnancy                            Anesthesia Physical Anesthesia Plan  ASA: III  Anesthesia Plan: Epidural   Post-op Pain Management:    Induction:   Airway Management Planned:   Additional Equipment:   Intra-op Plan:   Post-operative Plan:   Informed Consent: I have reviewed the patients History and Physical, chart, labs and discussed the procedure including the risks, benefits and alternatives for the proposed anesthesia with the patient or authorized representative who has indicated his/her understanding and acceptance.     Plan Discussed with: Anesthesiologist  Anesthesia Plan Comments:        Anesthesia Quick Evaluation

## 2016-04-12 NOTE — Consult Note (Signed)
Neonatology Note:   Attendance at Delivery:   I was asked by C. Gutierrez, CNM (for Dr. Pickens) to attend this NSVD at term due to meconium-stained fluid and a FHR deceleration during labor. The mother is a G2P0A1 A pos, GBS neg with late PNC, cigarette smoking, and obesity. ROM 0.5 hours prior to delivery, fluid bloody with thin meconium. CAN times 1 loosely. Infant vigorous with good spontaneous cry and tone. Needed only minimal bulb suctioning. Ap 9/9. Lungs clear to ausc in DR. To CN to care of Pediatrician.  Evvie Behrmann C. Rupa Lagan, MD 

## 2016-04-12 NOTE — Plan of Care (Signed)
Pt stable postpartum, transferred to 343 via w/c report [facility] to [facility] PierceRN

## 2016-04-13 LAB — CBC
HCT: 31.3 % — ABNORMAL LOW (ref 35.0–47.0)
Hemoglobin: 11.1 g/dL — ABNORMAL LOW (ref 12.0–16.0)
MCH: 32.3 pg (ref 26.0–34.0)
MCHC: 35.6 g/dL (ref 32.0–36.0)
MCV: 90.8 fL (ref 80.0–100.0)
Platelets: 247 10*3/uL (ref 150–440)
RBC: 3.45 MIL/uL — ABNORMAL LOW (ref 3.80–5.20)
RDW: 13.6 % (ref 11.5–14.5)
WBC: 12.7 10*3/uL — ABNORMAL HIGH (ref 3.6–11.0)

## 2016-04-13 LAB — RPR: RPR Ser Ql: NONREACTIVE

## 2016-04-13 MED ORDER — FENTANYL 2.5 MCG/ML W/ROPIVACAINE 0.2% IN NS 100 ML EPIDURAL INFUSION (ARMC-ANES): 10.0000 mL/h | EPIDURAL | Status: DC

## 2016-04-13 MED ORDER — PHENYLEPHRINE 40 MCG/ML (10ML) SYRINGE FOR IV PUSH (FOR BLOOD PRESSURE SUPPORT): 80.0000 ug | PREFILLED_SYRINGE | INTRAVENOUS | Status: DC | PRN

## 2016-04-13 MED ORDER — EPHEDRINE 5 MG/ML INJ
10.0000 mg | INTRAVENOUS | Status: DC | PRN
Start: 2016-04-13 — End: 2016-04-14

## 2016-04-13 MED ORDER — HYDROCODONE-ACETAMINOPHEN 5-325 MG PO TABS
1.0000 | ORAL_TABLET | Freq: Four times a day (QID) | ORAL | Status: DC | PRN
  Administered 2016-04-13 – 2016-04-14 (×2): 2 via ORAL
  Filled 2016-04-13 (×2): qty 2

## 2016-04-13 MED ORDER — DIPHENHYDRAMINE HCL 50 MG/ML IJ SOLN: 12.5000 mg | INTRAMUSCULAR | Status: DC | PRN

## 2016-04-13 MED ORDER — DIPHENHYDRAMINE HCL 25 MG PO CAPS
25.0000 mg | ORAL_CAPSULE | Freq: Four times a day (QID) | ORAL | Status: DC | PRN
  Administered 2016-04-13: 25 mg via ORAL
  Filled 2016-04-13: qty 1

## 2016-04-13 MED ORDER — LACTATED RINGERS IV SOLN: 500.0000 mL | Freq: Once | INTRAVENOUS | Status: DC

## 2016-04-13 NOTE — Anesthesia Postprocedure Evaluation (Signed)
Anesthesia Post Note  Patient: Anne Rogers  Procedure(s) Performed: * No procedures listed *  Patient location during evaluation: [facility] Anesthesia Type: Epidural Level of consciousness: awake, awake and alert and oriented Pain management: satisfactory to patient Vital Signs Assessment: post-procedure vital signs reviewed and stable Respiratory status: spontaneous breathing Cardiovascular status: stable Postop Assessment: no headache, no backache, no signs of nausea or vomiting and adequate PO intake    Last Vitals:  Filed Vitals:   04/13/16 0005 04/13/16 0315  BP: 111/74 104/58  Pulse: 84 83  Temp: 36.9 C 36.7 C  Resp: 18 18    Last Pain:  Filed Vitals:   04/13/16 0320  PainSc: 5                  Afsa Meany,  Wynona LunaPierre A

## 2016-04-13 NOTE — Progress Notes (Signed)
Post Partum Day 1 Subjective: tolerating PO and bleeding is decreasing. Had some burning initially with urination...better after using Dermaplast. Using cream on hemorrhoids  Objective: Blood pressure 108/69, pulse 92, temperature 98 F (36.7 C), temperature source Oral, resp. rate 20, height 5\' 3"  (1.6 m), weight 108.41 kg (239 lb), last menstrual period 07/01/2015, SpO2 100 %, unknown if currently breastfeeding.  Physical Exam:  General: alert and no distress Lochia: appropriate Uterine Fundus: firm/ at U/ ML/ NT Perineum: laceration intact, no hematoma seen DVT Evaluation: No evidence of DVT seen on physical exam.   Recent Labs  04/11/16 2116 04/13/16 0617  HGB 13.2 11.1*  HCT 37.4 31.3*  WBC 12.5* 12.7*  PLT 302 247    Assessment/Plan: Stable PPD #1 Plan for discharge tomorrow Continue postpartum care Support breast feeding A POS/ RI/ VNI- vaccinate for varicella prior to discharge    LOS: 2 days   Phillippa Straub 04/13/2016, 10:06 AM

## 2016-04-14 MED ORDER — TETANUS-DIPHTH-ACELL PERTUSSIS 5-2.5-18.5 LF-MCG/0.5 IM SUSP: 0.5000 mL | Freq: Once | INTRAMUSCULAR | Status: DC

## 2016-04-14 MED ORDER — HYDROCODONE-ACETAMINOPHEN 5-325 MG PO TABS: 1.0000 | ORAL_TABLET | Freq: Four times a day (QID) | ORAL | Status: DC | PRN

## 2016-04-14 MED ORDER — NORETHINDRONE ACETATE 5 MG PO TABS: 5.0000 mg | ORAL_TABLET | Freq: Every day | ORAL | Status: DC

## 2016-04-14 NOTE — Discharge Summary (Signed)
Obstetric Discharge Summary Reason for Admission: induction of labor Prenatal Procedures: none Intrapartum Procedures: spontaneous vaginal delivery Postpartum Procedures: none Complications-Operative and Postpartum: none HEMOGLOBIN  Date Value Ref Range Status  04/13/2016 11.1* 12.0 - 16.0 g/dL Final   HGB  Date Value Ref Range Status  10/23/2012 13.1 12.0-16.0 g/dL Final   HCT  Date Value Ref Range Status  04/13/2016 31.3* 35.0 - 47.0 % Final  10/23/2012 39.0 35.0-47.0 % Final    Physical Exam:  General: alert, cooperative and appears stated age 65Lochia: appropriate Uterine Fundus: firm Incision: NA DVT Evaluation: No evidence of DVT seen on physical exam.  Discharge Diagnoses: Term Pregnancy-delivered  Discharge Information: Date: 04/14/2016 Activity: pelvic rest Diet: routine Medications: Vicodin, Minipill Condition: stable Instructions: postpartum care instructions given Discharge to: home  Follow-up Information    Follow up with GUTIERREZ, COLLEEN, CNM. Schedule an appointment as soon as possible for a visit in 6 weeks.   Specialty:  Certified Nurse Midwife   Why:  postpartum follow up visit   Contact information:   [address] [place] [id number] [phone]       Newborn Data: Live born female  Birth Weight: 6 lb 12 oz (3062 g) APGAR: 8, 9  Home with mother. Breastfeeding  Shelley Pooley, CNM  This patient and plan were discussed with Dr Tiburcio PeaHarris 04/14/2016  04/14/2016, 10:58 AM

## 2016-04-14 NOTE — Progress Notes (Signed)
Pt discharged home with infant.  Discharge instructions and follow up appointment given to and reviewed with pt.  Pt verbalized understanding.  Escorted by auxillary. 

## 2016-07-10 IMAGING — US US ABDOMEN LIMITED
1 series · 14 of 25 positions shown · non-contrast
Comparison: Right upper quadrant ultrasound performed 09/26/2012

CLINICAL DATA: Acute onset of right upper quadrant abdominal pain
for several hours. Initial encounter.

EXAM:
US ABDOMEN LIMITED - RIGHT UPPER QUADRANT

[Series 1: us abdomen limited · 0.27mm/px · 14 of 39 slices shown]
[im 1/39]
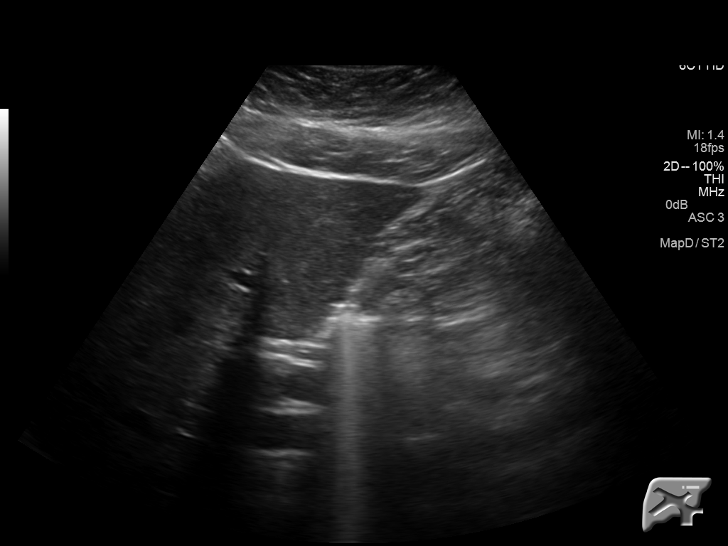
[im 4/39]
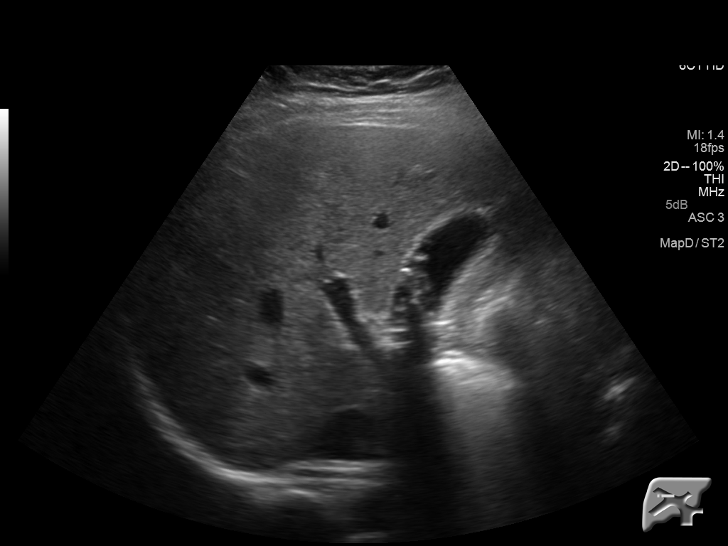
[im 7/39]
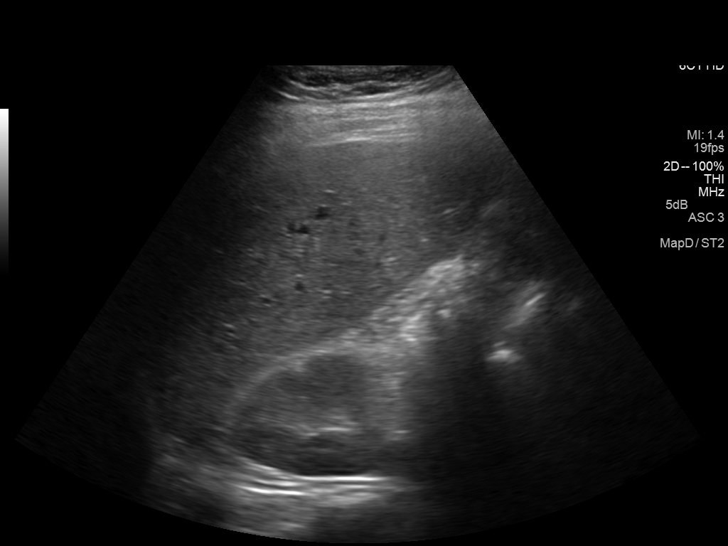
[im 10/39]
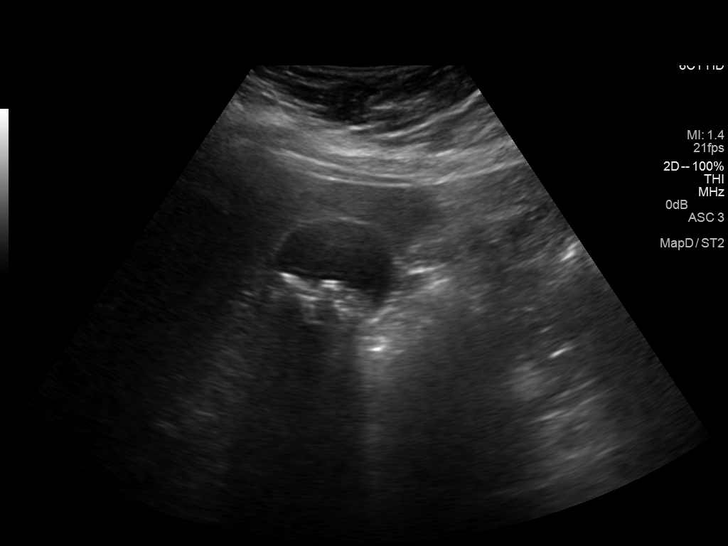
[im 13/39]
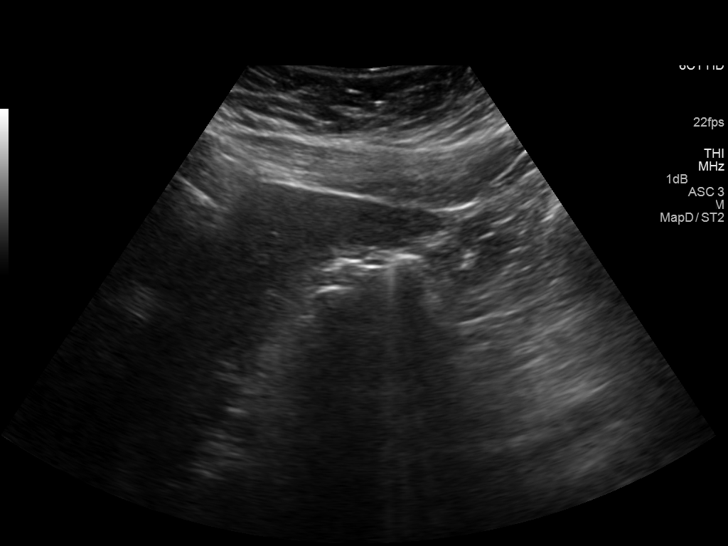
[im 15/39]
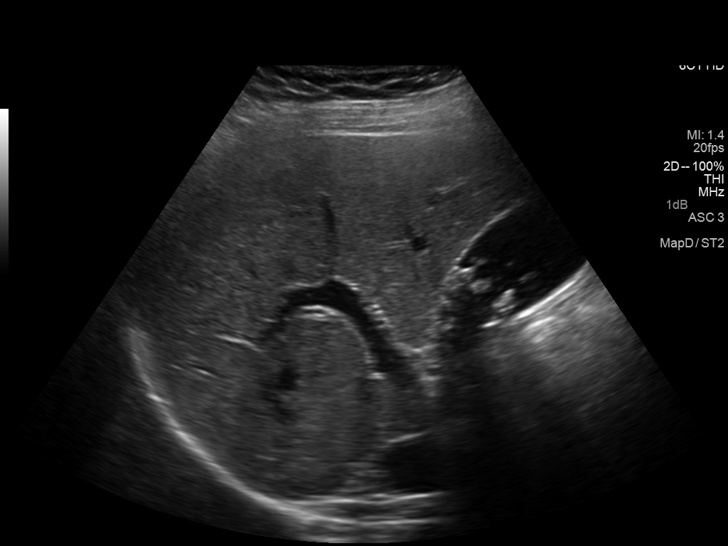
[im 18/39]
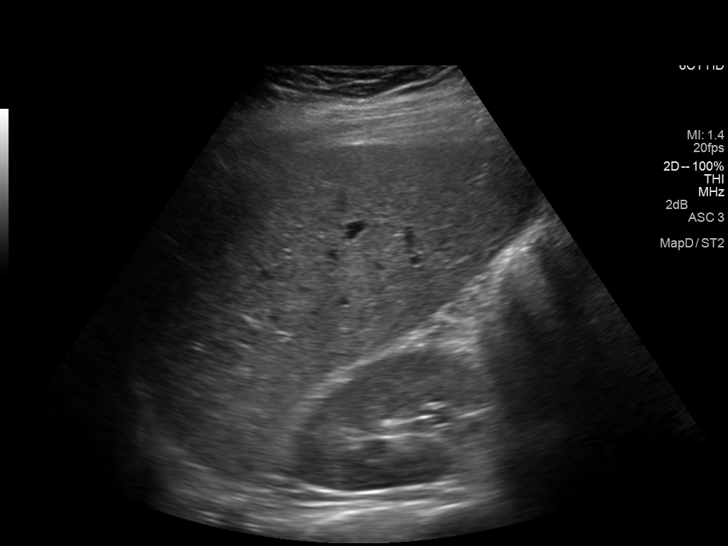
[im 21/39]
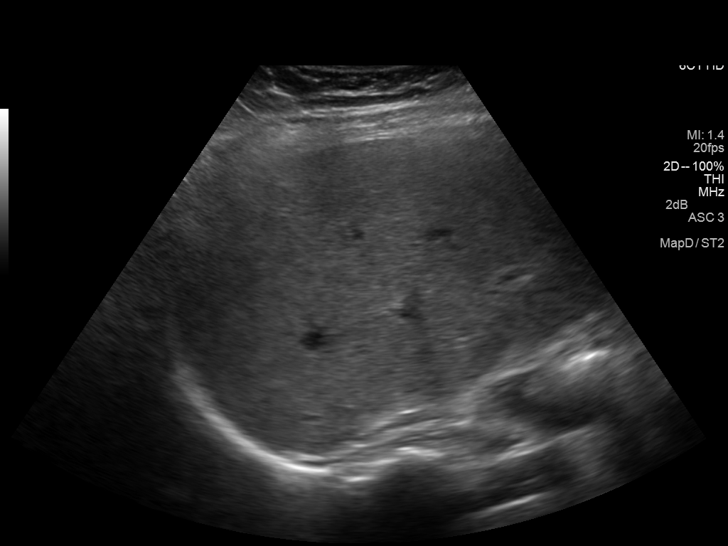
[im 24/39]
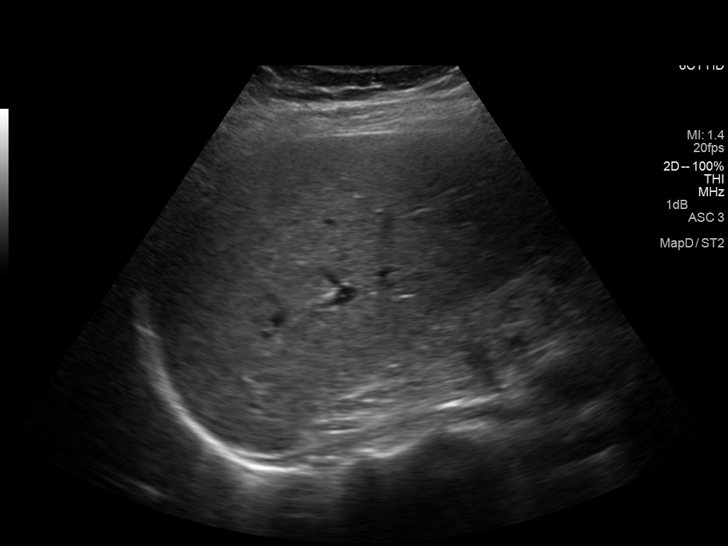
[im 26/39]
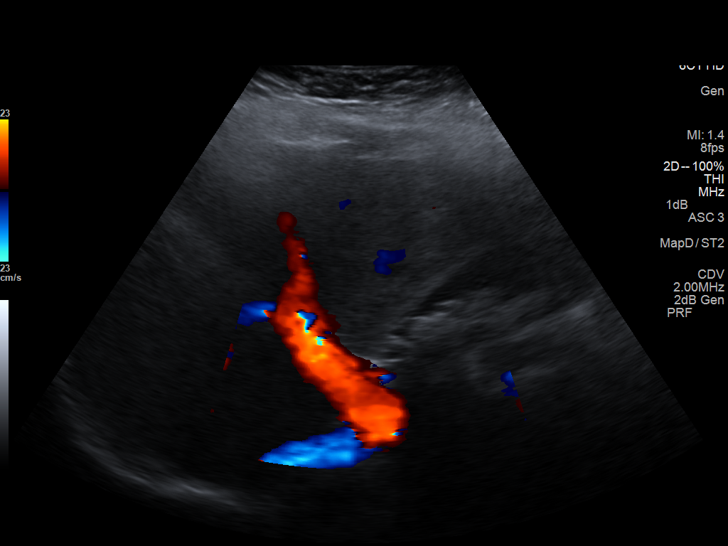
[im 29/39]
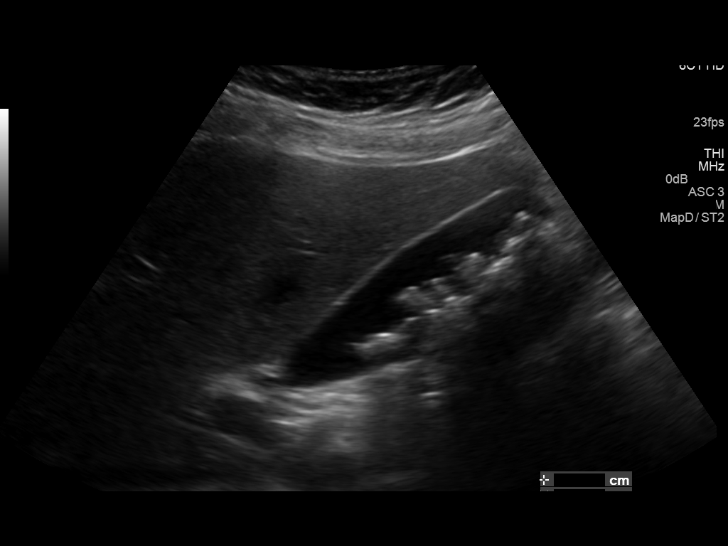
[im 32/39]
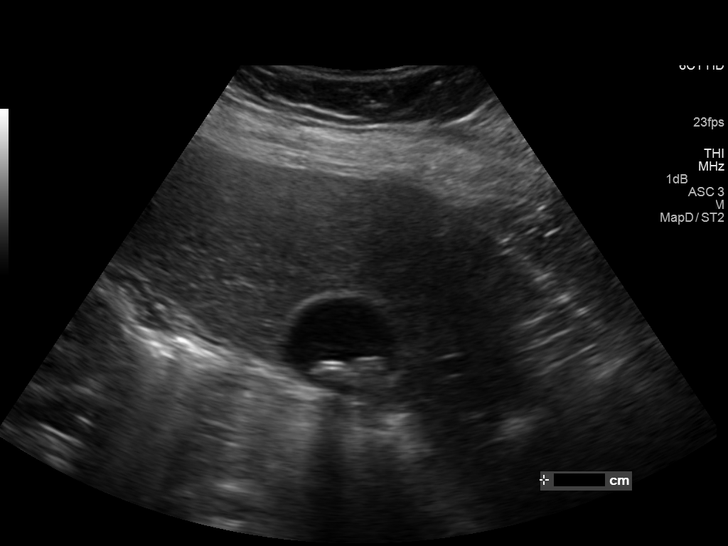
[im 35/39]
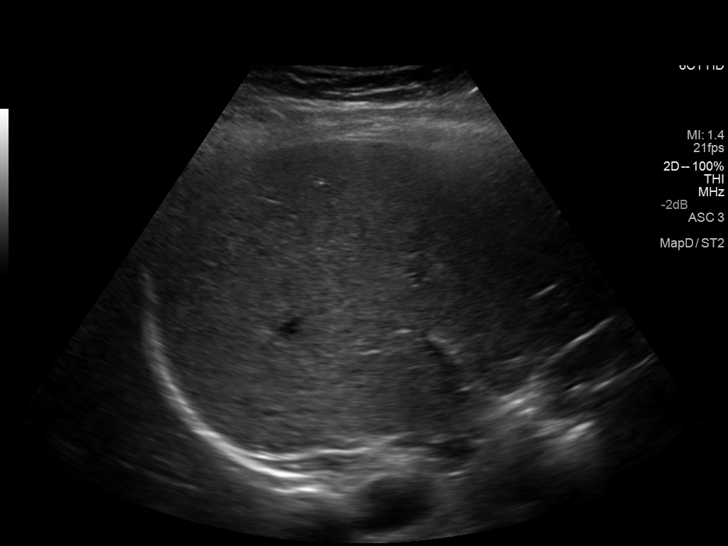
[im 39/39]
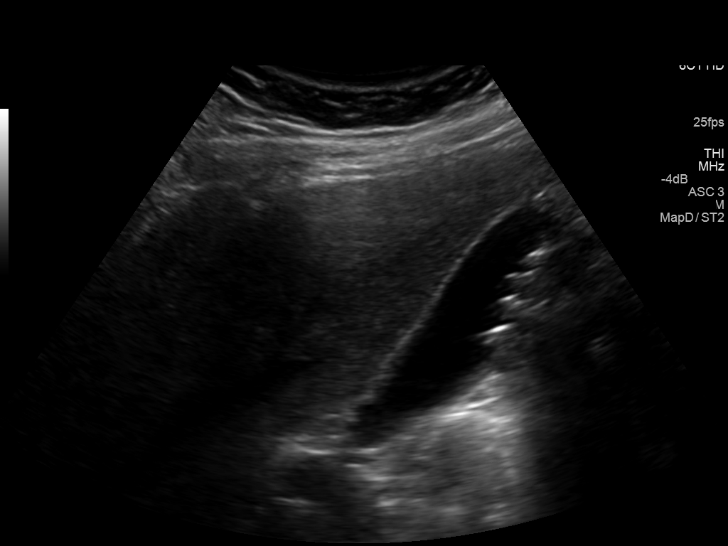

[14 of 25 positions shown; findings below may reference images not displayed]

FINDINGS: Gallbladder:

A number of stones are noted dependently within the gallbladder,
measuring up to 1.2 cm in size. No gallbladder wall thickening or
pericholecystic fluid is seen. No ultrasonographic Murphy's sign is
elicited.

Common bile duct:

Diameter: 0.3 cm, within normal limits in caliber.

Liver:

No focal lesion identified. Within normal limits in parenchymal
echogenicity.
IMPRESSION: Cholelithiasis. Gallbladder otherwise unremarkable. No evidence for
obstruction or cholecystitis.

## 2017-11-17 ENCOUNTER — Emergency Department
Admission: EM | Admit: 2017-11-17 | Discharge: 2017-11-17 | Disposition: A | Discharge status: Home / Self Care | Payer: Medicaid Other | Attending: Emergency Medicine | Admitting: Emergency Medicine

## 2017-11-17 ENCOUNTER — Encounter: Payer: Self-pay | Admitting: Emergency Medicine

## 2017-11-17 DIAGNOSIS — Z3490 Encounter for supervision of normal pregnancy, unspecified, unspecified trimester: Secondary | ICD-10-CM | POA: Diagnosis not present

## 2017-11-17 DIAGNOSIS — O9989 Other specified diseases and conditions complicating pregnancy, childbirth and the puerperium: Secondary | ICD-10-CM | POA: Diagnosis present

## 2017-11-17 DIAGNOSIS — B3731 Acute candidiasis of vulva and vagina: Secondary | ICD-10-CM

## 2017-11-17 DIAGNOSIS — F17210 Nicotine dependence, cigarettes, uncomplicated: Secondary | ICD-10-CM | POA: Diagnosis not present

## 2017-11-17 DIAGNOSIS — B373 Candidiasis of vulva and vagina: Secondary | ICD-10-CM

## 2017-11-17 DIAGNOSIS — O23599 Infection of other part of genital tract in pregnancy, unspecified trimester: Secondary | ICD-10-CM | POA: Diagnosis not present

## 2017-11-17 DIAGNOSIS — Z79899 Other long term (current) drug therapy: Secondary | ICD-10-CM | POA: Diagnosis not present

## 2017-11-17 DIAGNOSIS — Z3A00 Weeks of gestation of pregnancy not specified: Secondary | ICD-10-CM | POA: Insufficient documentation

## 2017-11-17 DIAGNOSIS — O99330 Smoking (tobacco) complicating pregnancy, unspecified trimester: Secondary | ICD-10-CM | POA: Insufficient documentation

## 2017-11-17 LAB — CHLAMYDIA/NGC RT PCR (ARMC ONLY)
Chlamydia Tr: NOT DETECTED
N gonorrhoeae: NOT DETECTED

## 2017-11-17 LAB — WET PREP, GENITAL
Clue Cells Wet Prep HPF POC: NONE SEEN
Sperm: NONE SEEN
Trich, Wet Prep: NONE SEEN
Yeast Wet Prep HPF POC: NONE SEEN

## 2017-11-17 LAB — POCT PREGNANCY, URINE: Preg Test, Ur: POSITIVE — AB

## 2017-11-17 MED ORDER — FLUCONAZOLE 100 MG PO TABS
150.0000 mg | ORAL_TABLET | ORAL | Status: AC
  Administered 2017-11-17: 150 mg via ORAL
  Filled 2017-11-17: qty 1

## 2017-11-17 NOTE — ED Notes (Signed)

## 2017-11-17 NOTE — ED Notes (Signed)
Pt and family updated on wait time and plan of care. No further questions or needs expressed at this time.

## 2017-11-17 NOTE — ED Provider Notes (Signed)
[facility] Emergency Department Provider Note  ____________________________________________   First MD Initiated Contact with Patient 11/17/17 0502     (approximate)  I have reviewed the triage vital signs and the nursing notes.   HISTORY  Chief Complaint Vaginal Itching    HPI Anne Rogers is a 21 y.o. female who presents for evaluation of vaginal itching and burning starting 1 week ago.  It is been gradually getting worse over the last week.  She has tried over-the-counter treatment with vaginal cell and nystatin but it has not helped.  When she urinates the external burning becomes worse.  Nothing helps it feel better.  She has had multiple yeast infections in the past and this feels similar but worse.  She has not been on antibiotics recently.  She was unaware she was pregnant until the urine pregnancy test here in the emergency department.  Her last menstrual cycle was about 4 weeks ago.  She describes the symptoms as severe.  She denies fever/chills, chest pain, shortness of breath, nausea, and vomiting.  She reports that she saw "may be a drop of blood" when she wiped after urinating once earlier today but attributed it to how raw she was feeling in her urogenital region.  Past Medical History:  Diagnosis Date  . Gall stones     Patient Active Problem List   Diagnosis Date Noted  . Postpartum care following vaginal delivery 04/14/2016  . Labor and delivery indication for care or intervention 04/11/2016  . Labor and delivery, indication for care 04/09/2016  . Gall stones     Past Surgical History:  Procedure Laterality Date  . ABDOMINAL SURGERY    . INDUCED ABORTION  02/2015    Prior to Admission medications   Medication Sig Start Date End Date Taking? Authorizing Provider  aspirin-acetaminophen-caffeine (EXCEDRIN MIGRAINE) 251 649 0034250-250-65 MG per tablet Take by mouth every 6 (six) hours as needed for headache. Reported on 04/11/2016    [provider]  HYDROcodone-acetaminophen (NORCO/VICODIN) 5-325 MG tablet Take 1-2 tablets by mouth every 6 (six) hours as needed for moderate pain or severe pain. 04/14/16   Tresea MallGledhill, Jane, CNM  norethindrone (AYGESTIN) 5 MG tablet Take 1 tablet (5 mg total) by mouth daily. 04/26/16   Tresea MallGledhill, Jane, CNM  Prenat-FeFum-FePo-FA-DHA w/o A (PROVIDA DHA) 16-16-1.25-110 MG CAPS Take 1 capsule by mouth daily at 12 noon.    [provider]  Tdap (BOOSTRIX) 5-2.5-18.5 LF-MCG/0.5 injection Inject 0.5 mLs into the muscle once. 04/14/16   Tresea MallGledhill, Jane, CNM    Allergies Patient has no known allergies.  No family history on file.  Social History Social History   Tobacco Use  . Smoking status: Current Every Day Smoker    Packs/day: 0.25    Years: 1.50    Pack years: 0.37    Types: Cigarettes  . Smokeless tobacco: Never Used  Substance Use Topics  . Alcohol use: No  . Drug use: No    Review of Systems Constitutional: No fever/chills Respiratory: Denies shortness of breath. Gastrointestinal: No abdominal pain.  No nausea, no vomiting.  No diarrhea.  No constipation. Genitourinary: Itching and burning in the urogenital region.  A drop of blood when she wiped after urinating earlier today, none since that time.  Positive pregnancy test in the emergency department Integumentary: redness and swelling of the urogenital region Neurological: Negative for headaches, focal weakness or numbness.   ____________________________________________   PHYSICAL EXAM:  VITAL SIGNS: ED Triage Vitals  Enc  Vitals Group     BP 11/17/17 0139 123/69     Pulse Rate 11/17/17 0139 100     Resp 11/17/17 0139 18     Temp 11/17/17 0139 98.4 F (36.9 C)     Temp Source 11/17/17 0139 Oral     SpO2 11/17/17 0139 97 %     Weight --      Height 11/17/17 0139 1.6 m (5\' 3" )     Head Circumference --      Peak Flow --      Pain Score 11/17/17 0138 7     Pain Loc --      Pain Edu? --      Excl. in GC? --      Constitutional: Alert and oriented. Well appearing and in no acute distress. Eyes: Conjunctivae are normal.  Head: Atraumatic. Nose: No congestion/rhinnorhea. Cardiovascular: Normal rate, regular rhythm. Good peripheral circulation.  Respiratory: Normal respiratory effort.  No retractions.  Gastrointestinal: Soft and nontender. No distention.  Genitourinary: Labia majora and labia minora are erythematous and there is a whitish discharge present.  Patient also has whitish vaginal discharge present in the vaginal vault.  Cervix is normal in appearance and closed and there is no blood in the os or vagina.  ED chaperone present throughout exam.  Musculoskeletal: No lower extremity tenderness nor edema. No gross deformities of extremities. Neurologic:  Normal speech and language. No gross focal neurologic deficits are appreciated.  Skin:  Skin is warm, dry and intact. No rash noted. Psychiatric: Mood and affect are normal. Speech and behavior are normal.  ____________________________________________   LABS (all labs ordered are listed, but only abnormal results are displayed)  Labs Reviewed  WET PREP, GENITAL - Abnormal; Notable for the following components:      Result Value   WBC, Wet Prep HPF POC MANY (*)    All other components within normal limits  POCT PREGNANCY, URINE - Abnormal; Notable for the following components:   Preg Test, Ur POSITIVE (*)    All other components within normal limits  CHLAMYDIA/NGC RT PCR ([facility] ONLY)   ____________________________________________  EKG  None - EKG not ordered by ED physician ____________________________________________  RADIOLOGY   No results found.  ____________________________________________   PROCEDURES  Critical Care performed: No   Procedure(s) performed:   Procedures   ____________________________________________   INITIAL IMPRESSION / ASSESSMENT AND PLAN / ED COURSE  As part of my medical  decision making, I reviewed the following data within the electronic MEDICAL RECORD NUMBER Nursing notes reviewed and incorporated and Labs reviewed     Differential diagnosis includes but is not limited to sexually transmitted disease, bacterial vaginosis, candidal vaginitis, contact or nonspecific dermatitis, UTI.  Given the skin changes visible on pelvic exam I suspect candidal vaginitis.  I reviewed current online resources and found that fluconazole 150 mg single dose during pregnancy is not associated with any adverse fetal effects or events, it is only high-dose continued fluconazole that leads to possible bad outcomes.  Given that she has tried topical over-the-counter medications I think it is appropriate to try a dose of fluconazole and I explained all this to her and she agreed.  The patient had a very long wait due to me being occupied with multiple critical patients during her ED visit.  She and her husband were needing to go by the time we had the opportunity to perform the pelvic exam and they wanted to go immediately.  I explained to her  the need to further workup the questionable vaginal bleeding during early pregnancy, but they are very adamant that they need to go because they have 3 other children at home.  Given that she has no blood in the vagina at this time and she describes seeing "only 1 drop", I will not make her sign out AMA but I stressed to her how important it is that she return if she develops any more bleeding or any other symptoms, and I explained the need for blood typing and possible RhoGam if for nothing else.  At this point she has no abdominal or pelvic tenderness.  We gave a dose of Diflucan in the emergency department and I advised that she should follow-up with OB/GYN at the next available opportunity.   we were not able to test the urinalysis during the time she was in the emergency department.  When she left she understood they return precautions.      ____________________________________________  FINAL CLINICAL IMPRESSION(S) / ED DIAGNOSES  Final diagnoses:  Vaginal candidiasis  Pregnancy at early stage     MEDICATIONS GIVEN DURING THIS VISIT:  Medications  fluconazole (DIFLUCAN) tablet 150 mg (150 mg Oral Given 11/17/17 0644)     ED Discharge Orders    None       Note:  This document was prepared using Dragon voice recognition software and may include unintentional dictation errors.    Anne Rose, MD 11/17/17 [phone]

## 2017-11-17 NOTE — ED Triage Notes (Signed)
Pt reports vaginal itching and burning that started a week ago. Pt states today itching and burning has gotten worse. Pt reports attempting OTC treatmment with vagisil & nystatin for the last two day with no relief.

## 2017-11-17 NOTE — ED Notes (Signed)
ED Provider at bedside. 

## 2017-11-17 NOTE — Discharge Instructions (Signed)
As we discussed, we gave you a one-time dose of medication called fluconazole 150 mg by mouth which should help with your itching.  It should be safe to take this medication in this dose during pregnancy, but we recommend you follow up at the next available opportunity with an OB/GYN.  Also if you see any additional blood, whether vaginal or in your urine, please return to the Emergency Department or your [facility] provider as soon as possible.  We understand that you cannot stay for further evaluation this morning, but if you are, in fact, having vaginal bleeding in early pregnancy, it is very important that you be evaluated further.  Return to the emergency department if you develop new or worsening symptoms that concern you.

## 2018-06-02 ENCOUNTER — Other Ambulatory Visit: Payer: Self-pay

## 2018-06-02 ENCOUNTER — Emergency Department: Payer: Medicaid Other

## 2018-06-02 ENCOUNTER — Emergency Department
Admission: EM | Admit: 2018-06-02 | Discharge: 2018-06-02 | Disposition: A | Discharge status: Home / Self Care | Payer: Medicaid Other | Attending: Emergency Medicine | Admitting: Emergency Medicine

## 2018-06-02 ENCOUNTER — Encounter: Payer: Self-pay | Admitting: Emergency Medicine

## 2018-06-02 DIAGNOSIS — Y929 Unspecified place or not applicable: Secondary | ICD-10-CM | POA: Insufficient documentation

## 2018-06-02 DIAGNOSIS — Z23 Encounter for immunization: Secondary | ICD-10-CM | POA: Insufficient documentation

## 2018-06-02 DIAGNOSIS — Y939 Activity, unspecified: Secondary | ICD-10-CM | POA: Insufficient documentation

## 2018-06-02 DIAGNOSIS — Y999 Unspecified external cause status: Secondary | ICD-10-CM | POA: Insufficient documentation

## 2018-06-02 DIAGNOSIS — Z79899 Other long term (current) drug therapy: Secondary | ICD-10-CM | POA: Insufficient documentation

## 2018-06-02 DIAGNOSIS — F17210 Nicotine dependence, cigarettes, uncomplicated: Secondary | ICD-10-CM | POA: Insufficient documentation

## 2018-06-02 DIAGNOSIS — S51812A Laceration without foreign body of left forearm, initial encounter: Secondary | ICD-10-CM

## 2018-06-02 DIAGNOSIS — W228XXA Striking against or struck by other objects, initial encounter: Secondary | ICD-10-CM | POA: Insufficient documentation

## 2018-06-02 MED ORDER — LIDOCAINE HCL (PF) 1 % IJ SOLN
20.0000 mL | Freq: Once | INTRAMUSCULAR | Status: AC
  Administered 2018-06-02: 20 mL
  Filled 2018-06-02: qty 20

## 2018-06-02 MED ORDER — TETANUS-DIPHTH-ACELL PERTUSSIS 5-2.5-18.5 LF-MCG/0.5 IM SUSP
0.5000 mL | Freq: Once | INTRAMUSCULAR | Status: AC
  Administered 2018-06-02: 0.5 mL via INTRAMUSCULAR
  Filled 2018-06-02: qty 0.5

## 2018-06-02 MED ORDER — CEPHALEXIN 750 MG PO CAPS: 750.0000 mg | ORAL_CAPSULE | Freq: Two times a day (BID) | ORAL | 0 refills | Status: DC

## 2018-06-02 NOTE — ED Triage Notes (Signed)
Shut arm in screen door approx 1700. States had a piece of metal sticking out of it and it impaled her L forearm. States pulled metal piece out.

## 2018-06-02 NOTE — ED Provider Notes (Signed)
[facility] Emergency Department Provider Note  ____________________________________________  Time seen: Approximately 7:09 PM  I have reviewed the triage vital signs and the nursing notes.   HISTORY  Chief Complaint Laceration    HPI Anne Rogers is a 22 y.o. female who presents the emergency department with the laceration to the left forearm.  Patient reports that she was on the phone, arguing with somebody and went to slam the door.  Patient reports that during this process she dropped her phone.  Instinctively she reached for it and her arm became entrapped in the closing door.  Patient sustained a significant laceration to the left forearm.  She denies any other injury or complaint.  She is unsure of her last tetanus shot.  Patient reports gaping laceration with visible foreign body initially.  She removed foreign body which she described as metal prior to arrival.  She denies any loss of range of motion to the elbow or wrist.    Past Medical History:  Diagnosis Date  . Gall stones     Patient Active Problem List   Diagnosis Date Noted  . Postpartum care following vaginal delivery 04/14/2016  . Labor and delivery indication for care or intervention 04/11/2016  . Labor and delivery, indication for care 04/09/2016  . Gall stones     Past Surgical History:  Procedure Laterality Date  . ABDOMINAL SURGERY    . INDUCED ABORTION  02/2015    Prior to Admission medications   Medication Sig Start Date End Date Taking? Authorizing Provider  aspirin-acetaminophen-caffeine (EXCEDRIN MIGRAINE) [phone] MG per tablet Take by mouth every 6 (six) hours as needed for headache. Reported on 04/11/2016    [provider]  cephALEXin (KEFLEX) 750 MG capsule Take 1 capsule (750 mg total) by mouth 2 (two) times daily. 06/02/18   Laray Corbit, Delorise Royals, PA-C  HYDROcodone-acetaminophen (NORCO/VICODIN) 5-325 MG tablet Take 1-2 tablets by mouth every 6 (six) hours  as needed for moderate pain or severe pain. 04/14/16   Tresea Mall, CNM  norethindrone (AYGESTIN) 5 MG tablet Take 1 tablet (5 mg total) by mouth daily. 04/26/16   Tresea Mall, CNM  Prenat-FeFum-FePo-FA-DHA w/o A (PROVIDA DHA) 16-16-1.25-110 MG CAPS Take 1 capsule by mouth daily at 12 noon.    [provider]  Tdap (BOOSTRIX) 5-2.5-18.5 LF-MCG/0.5 injection Inject 0.5 mLs into the muscle once. 04/14/16   Tresea Mall, CNM    Allergies Patient has no known allergies.  No family history on file.  Social History Social History   Tobacco Use  . Smoking status: Current Every Day Smoker    Packs/day: 0.25    Years: 1.50    Pack years: 0.37    Types: Cigarettes  . Smokeless tobacco: Never Used  Substance Use Topics  . Alcohol use: No  . Drug use: No     Review of Systems  Constitutional: No fever/chills Eyes: No visual changes.  Cardiovascular: no chest pain. Respiratory: no cough. No SOB. Gastrointestinal: No abdominal pain.  No nausea, no vomiting.  Musculoskeletal: Negative for musculoskeletal pain. Skin: Positive for deep laceration to left forearm. Neurological: Negative for headaches, focal weakness or numbness. 10-point ROS otherwise negative.  ____________________________________________   PHYSICAL EXAM:  VITAL SIGNS: ED Triage Vitals [06/02/18 1757]  Enc Vitals Group     BP (!) 149/102     Pulse Rate (!) 110     Resp 20     Temp 98.2 F (36.8 C)     Temp Source  Oral     SpO2 99 %     Weight 250 lb (113.4 kg)     Height 5\' 3"  (1.6 m)     Head Circumference      Peak Flow      Pain Score 8     Pain Loc      Pain Edu?      Excl. in [place]?      Constitutional: Alert and oriented. Well appearing and in no acute distress. Eyes: Conjunctivae are normal. PERRL. EOMI. Head: Atraumatic. Neck: No stridor.    Cardiovascular: Normal rate, regular rhythm. Normal S1 and S2.  Good peripheral circulation. Respiratory: Normal respiratory effort without  tachypnea or retractions. Lungs CTAB. Good air entry to the bases with no decreased or absent breath sounds. Musculoskeletal: Full range of motion to all extremities. No gross deformities appreciated. Neurologic:  Normal speech and language. No gross focal neurologic deficits are appreciated.  Skin:  Skin is warm, dry and intact. No rash noted.  Patient with significant laceration noted to the left forearm.  This measures approximately 10 cm in length.  Edges are significantly gaped open.  Subcutaneous tissue exposed.  Muscle belly appreciated with no disruption of the fascia.  No visible foreign body.  Mild bleeding is controlled with direct pressure.  Examination of the elbow and wrist is unremarkable.  Dorsalis pedis pulse intact distally.  Sensation intact all 5 digits distally. Psychiatric: Mood and affect are normal. Speech and behavior are normal. Patient exhibits appropriate insight and judgement.   ____________________________________________   LABS (all labs ordered are listed, but only abnormal results are displayed)  Labs Reviewed - No data to display ____________________________________________  EKG   ____________________________________________  RADIOLOGY  I concur with radiologist finding of no fractures or radiopaque foreign body.  Dg Forearm Left  Result Date: 06/02/2018 CLINICAL DATA:  Shot arm in screen door. Left forearm laceration and pain. Initial encounter. EXAM: LEFT FOREARM - 2 VIEW COMPARISON:  None. FINDINGS: There is no evidence of fracture or other focal bone lesions. Soft tissue laceration is seen involving the mid forearm, however there is no evidence of radiopaque foreign body. IMPRESSION: Mid forearm soft tissue laceration. No evidence of radiopaque foreign body or fracture. Electronically Signed   By: Myles Rosenthal M.D.   On: 06/02/2018 20:13    ____________________________________________    PROCEDURES  Procedure(s) performed:    Marland KitchenMarland KitchenLaceration  Repair Date/Time: 06/02/2018 9:31 PM Performed by: Racheal Patches, PA-C Authorized by: Racheal Patches, PA-C   Consent:    Consent obtained:  Verbal   Consent given by:  Patient   Risks discussed:  Pain and poor cosmetic result Anesthesia (see MAR for exact dosages):    Anesthesia method:  Local infiltration   Local anesthetic:  Lidocaine 1% w/o epi Laceration details:    Location:  Shoulder/arm   Shoulder/arm location:  L lower arm   Length (cm):  10   Depth (mm):  15 Repair type:    Repair type:  Complex Pre-procedure details:    Preparation:  Patient was prepped and draped in usual sterile fashion and imaging obtained to evaluate for foreign bodies Exploration:    Hemostasis achieved with:  Direct pressure   Wound exploration: wound explored through full range of motion and entire depth of wound probed and visualized     Wound extent: no fascia violation noted, no foreign bodies/material noted, no muscle damage noted, no nerve damage noted, no tendon damage noted, no underlying fracture  noted and no vascular damage noted     Contaminated: no   Treatment:    Area cleansed with:  Betadine   Amount of cleaning:  Extensive   Irrigation solution:  Sterile saline   Irrigation volume:  1L   Irrigation method:  Syringe   Debridement:  Minimal   Undermining:  None Subcutaneous repair:    Suture size:  5-0   Wound subcutaneous closure material used: Monocryl.   Suture technique:  Simple interrupted and running   Number of sutures:  7 (6 deep simple interrupted subcutaneous stitches.  1 running subcutaneous suture just under epidermal layer.) Skin repair:    Repair method:  Sutures   Suture size:  4-0   Suture material:  Nylon   Suture technique:  Horizontal mattress   Number of sutures:  9 Approximation:    Approximation:  Close Post-procedure details:    Dressing:  Tube gauze   Patient tolerance of procedure:  Tolerated well, no immediate  complications      Medications  Tdap (BOOSTRIX) injection 0.5 mL (0.5 mLs Intramuscular Given 06/02/18 1932)  lidocaine (PF) (XYLOCAINE) 1 % injection 20 mL (20 mLs Infiltration Given 06/02/18 1935)     ____________________________________________   INITIAL IMPRESSION / ASSESSMENT AND PLAN / ED COURSE  Pertinent labs & imaging results that were available during my care of the patient were reviewed by me and considered in my medical decision making (see chart for details).  Review of the [place] CSRS was performed in accordance of the [facility] prior to dispensing any controlled drugs.      Patient's diagnosis is consistent with forearm laceration.  Patient sustained a significant forearm laceration prior to arrival.  Bleeding was mostly controlled on arrival.  Patient had a foreign body that she extracted prior to arrival.  Imaging reveals no underlying fractures or retained foreign body.  Patient had significant laceration repair requiring multiple layers of sutures.  See above note for details.  Patient's tetanus shot updated tonight. Patient will be discharged home with prescriptions for antibiotics prophylactically. Patient is to follow up with primary care in 10 days for suture removal or sooner as needed or otherwise directed. Patient is given ED precautions to return to the ED for any worsening or new symptoms.     ____________________________________________  FINAL CLINICAL IMPRESSION(S) / ED DIAGNOSES  Final diagnoses:  Laceration of left forearm, initial encounter      NEW MEDICATIONS STARTED DURING THIS VISIT:  ED Discharge Orders        Ordered    cephALEXin (KEFLEX) 750 MG capsule  2 times daily     06/02/18 2127          This chart was dictated using voice recognition software/Dragon. Despite best efforts to proofread, errors can occur which can change the meaning. Any change was purely unintentional.    Racheal PatchesCuthriell, Natesha Hassey D, PA-C 06/02/18 2134    Arnaldo NatalMalinda,  Paul F, MD 06/03/18 [phone]

## 2018-08-29 IMAGING — DX DG FOREARM 2V*L*
3 series · 3 of 3 positions shown · non-contrast
Comparison: None.

CLINICAL DATA: Shot arm in screen door. Left forearm laceration and
pain. Initial encounter.

EXAM:
LEFT FOREARM - 2 VIEW

[forearm ap (1 of 2)]
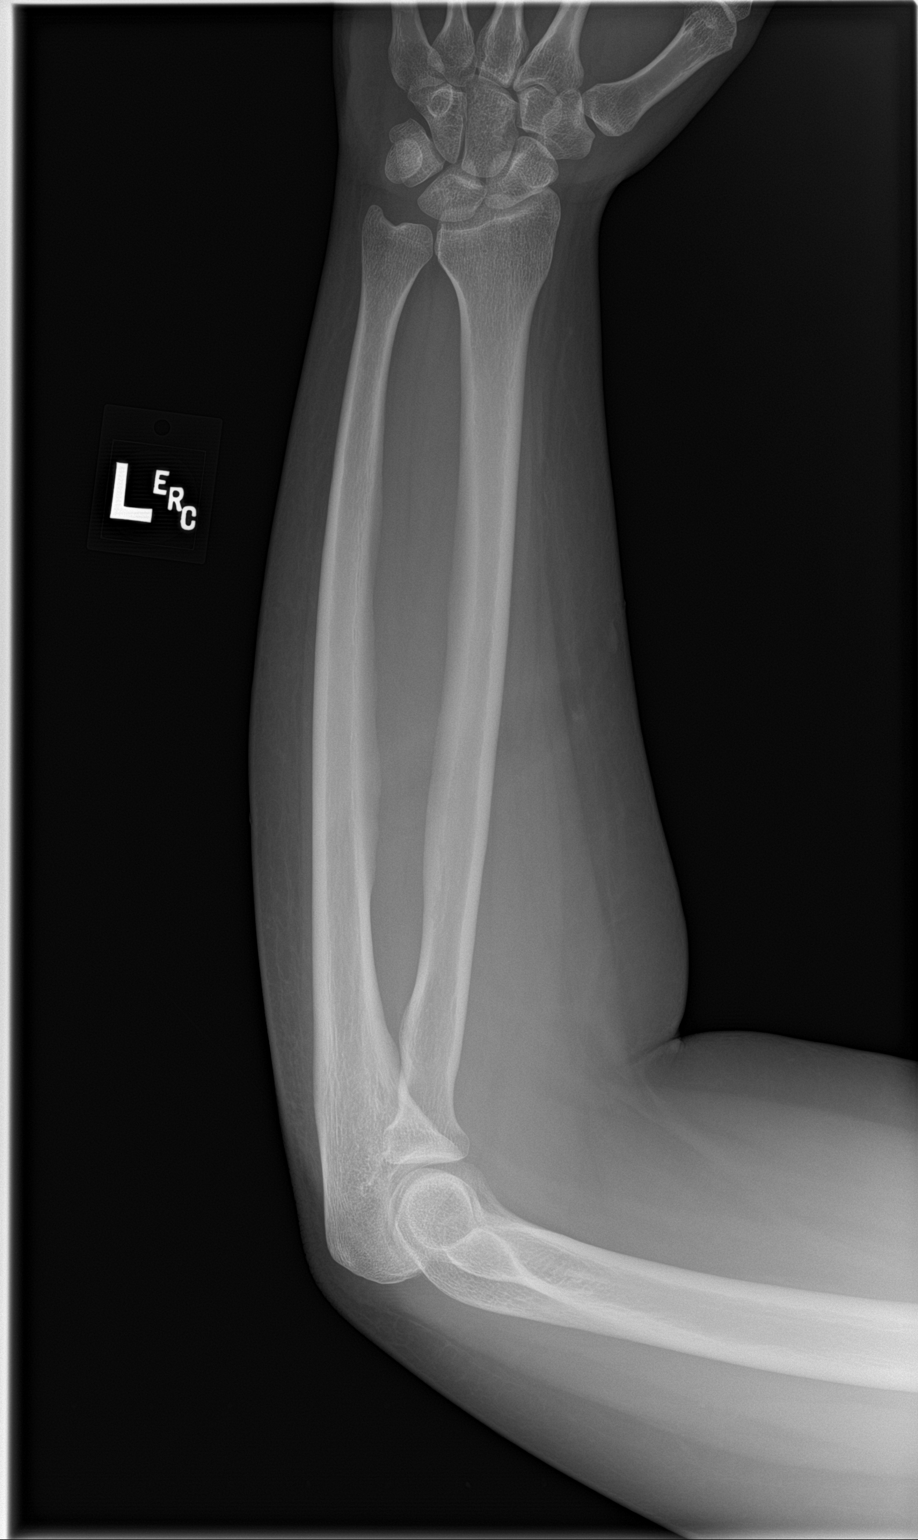

[forearm lat]
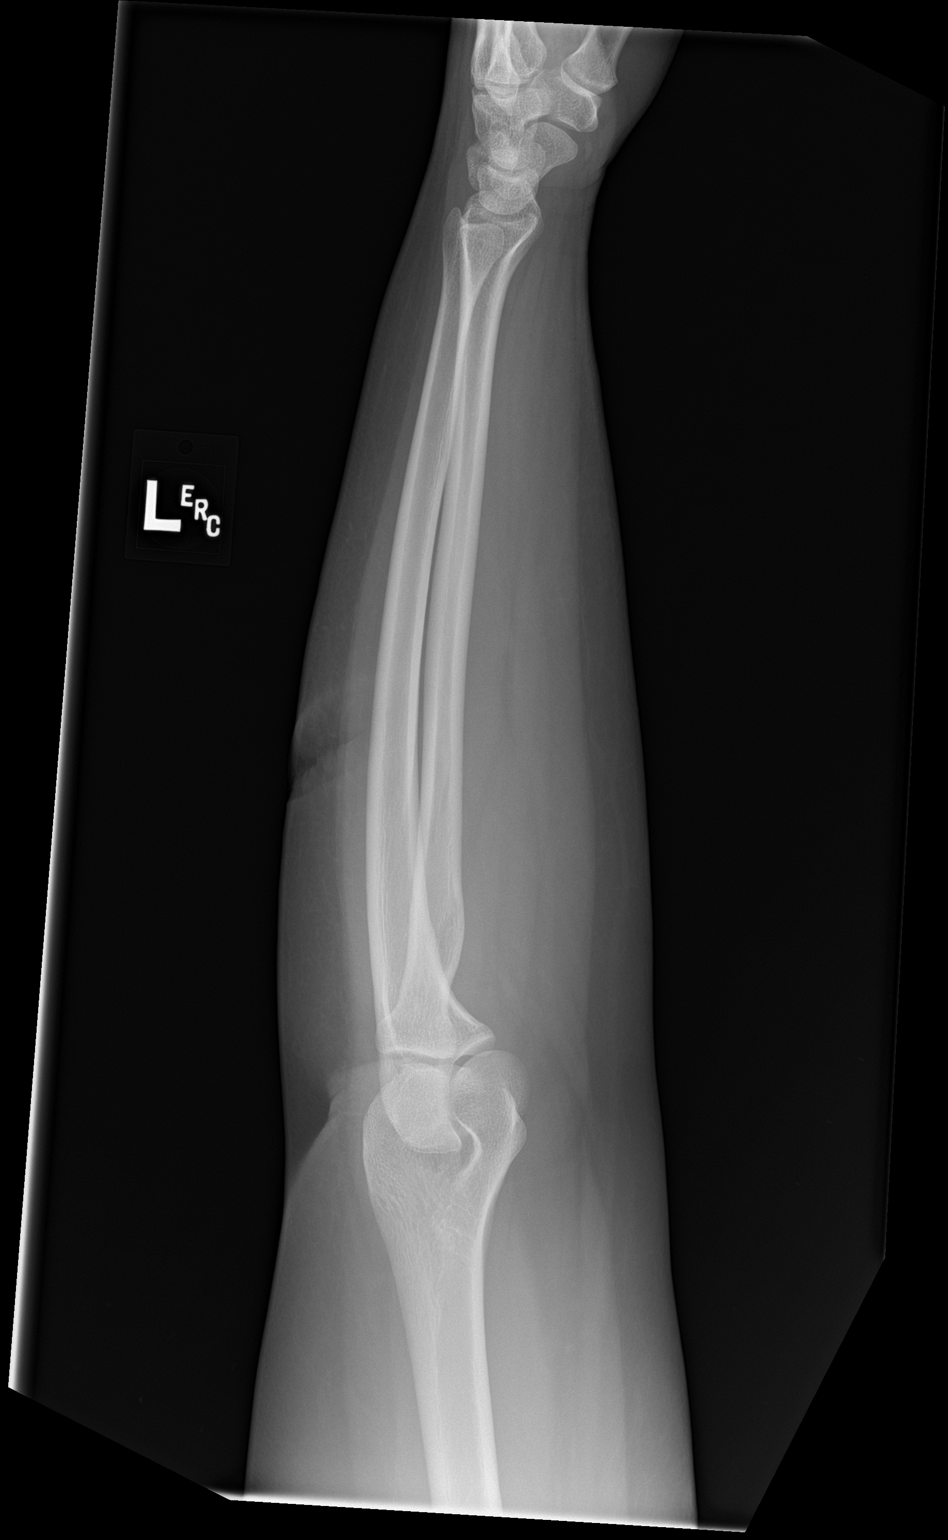

[forearm ap (2 of 2)]
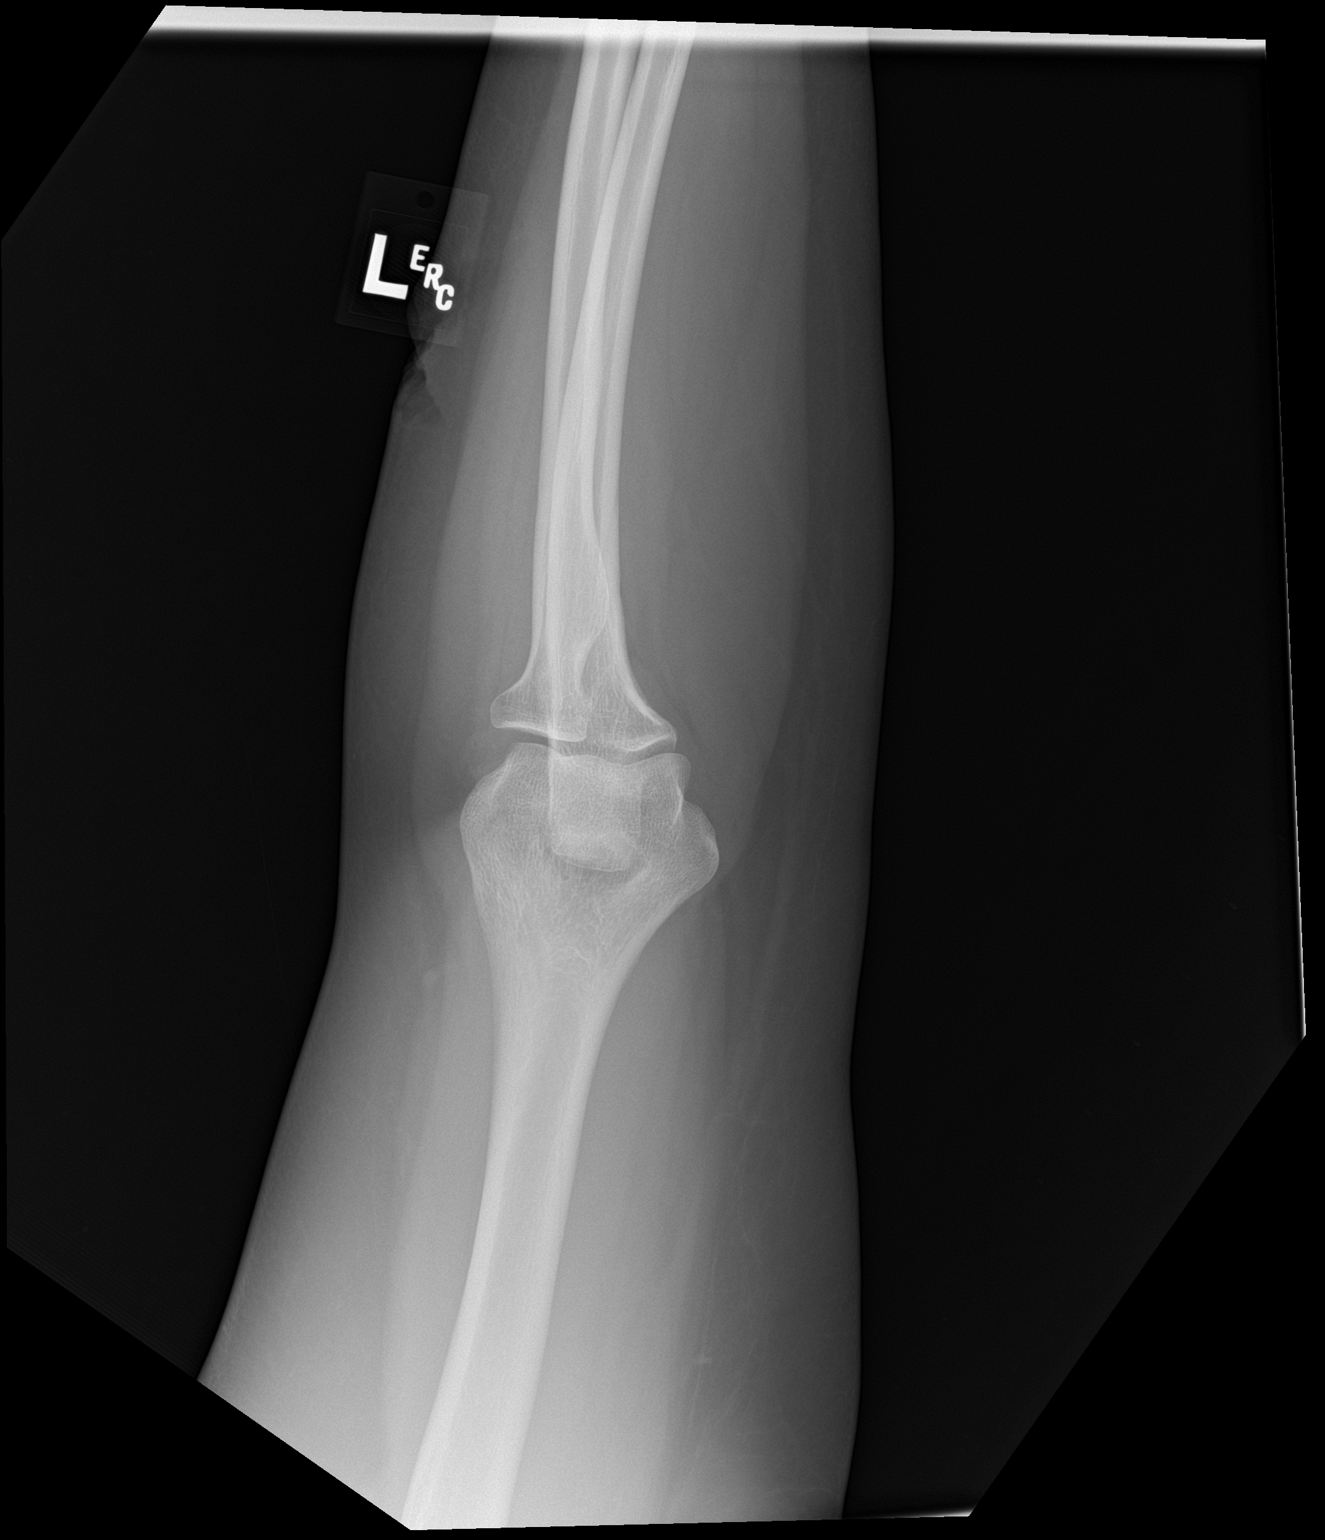

[3 of 3 positions shown; findings below may reference images not displayed]

FINDINGS: There is no evidence of fracture or other focal bone lesions. Soft
tissue laceration is seen involving the mid forearm, however there
is no evidence of radiopaque foreign body.
IMPRESSION: Mid forearm soft tissue laceration. No evidence of radiopaque
foreign body or fracture.

## 2019-06-27 ENCOUNTER — Emergency Department: Payer: Medicaid Other

## 2019-06-27 ENCOUNTER — Encounter: Payer: Self-pay | Admitting: Emergency Medicine

## 2019-06-27 ENCOUNTER — Emergency Department
Admission: EM | Admit: 2019-06-27 | Discharge: 2019-06-27 | Disposition: A | Discharge status: Home / Self Care | Payer: Medicaid Other | Attending: Emergency Medicine | Admitting: Emergency Medicine

## 2019-06-27 ENCOUNTER — Other Ambulatory Visit: Payer: Self-pay

## 2019-06-27 DIAGNOSIS — M549 Dorsalgia, unspecified: Secondary | ICD-10-CM | POA: Diagnosis present

## 2019-06-27 DIAGNOSIS — M546 Pain in thoracic spine: Secondary | ICD-10-CM | POA: Diagnosis not present

## 2019-06-27 DIAGNOSIS — R0789 Other chest pain: Secondary | ICD-10-CM | POA: Insufficient documentation

## 2019-06-27 DIAGNOSIS — Z79899 Other long term (current) drug therapy: Secondary | ICD-10-CM | POA: Diagnosis not present

## 2019-06-27 DIAGNOSIS — F17210 Nicotine dependence, cigarettes, uncomplicated: Secondary | ICD-10-CM | POA: Insufficient documentation

## 2019-06-27 LAB — POCT PREGNANCY, URINE: Preg Test, Ur: NEGATIVE

## 2019-06-27 MED ORDER — KETOROLAC TROMETHAMINE 30 MG/ML IJ SOLN
30.0000 mg | Freq: Once | INTRAMUSCULAR | Status: AC
  Administered 2019-06-27: 30 mg via INTRAMUSCULAR
  Filled 2019-06-27: qty 1

## 2019-06-27 MED ORDER — CYCLOBENZAPRINE HCL 10 MG PO TABS: 10.0000 mg | ORAL_TABLET | Freq: Three times a day (TID) | ORAL | 0 refills | Status: DC | PRN

## 2019-06-27 MED ORDER — KETOROLAC TROMETHAMINE 10 MG PO TABS: 10.0000 mg | ORAL_TABLET | Freq: Four times a day (QID) | ORAL | 0 refills | Status: DC | PRN

## 2019-06-27 MED ORDER — DIAZEPAM 5 MG PO TABS
5.0000 mg | ORAL_TABLET | Freq: Once | ORAL | Status: AC
  Administered 2019-06-27: 5 mg via ORAL
  Filled 2019-06-27: qty 1

## 2019-06-27 NOTE — ED Triage Notes (Signed)
Patient coming in today for back pain x 6 months and states she woke up this morning and was in so much pain. She took 1500mg  Tylenol and still 10/10 pain. Patient denies and numbness or tingling but does have some nausea.

## 2019-06-27 NOTE — ED Provider Notes (Signed)
[facility] Emergency Department Provider Note ____________________________________________  Time seen: Approximately 7:06 AM  I have reviewed the triage vital signs and the nursing notes.  HISTORY  Chief Complaint No chief complaint on file.   HPI Anne Rogers is a 23 y.o. female who presents to the emergency department for treatment and evaluation of chronic back pain. Pain is worse today. No relief with tylenol. No injury.  Past Medical History:  Diagnosis Date  . Gall stones     Patient Active Problem List   Diagnosis Date Noted  . Postpartum care following vaginal delivery 04/14/2016  . Labor and delivery indication for care or intervention 04/11/2016  . Labor and delivery, indication for care 04/09/2016  . Gall stones     Past Surgical History:  Procedure Laterality Date  . ABDOMINAL SURGERY    . INDUCED ABORTION  02/2015    Prior to Admission medications   Medication Sig Start Date End Date Taking? Authorizing Provider  aspirin-acetaminophen-caffeine (EXCEDRIN MIGRAINE) [phone] MG per tablet Take by mouth every 6 (six) hours as needed for headache. Reported on 04/11/2016    [provider]  cephALEXin (KEFLEX) 750 MG capsule Take 1 capsule (750 mg total) by mouth 2 (two) times daily. 06/02/18   Cuthriell, Delorise RoyalsJonathan D, PA-C  HYDROcodone-acetaminophen (NORCO/VICODIN) 5-325 MG tablet Take 1-2 tablets by mouth every 6 (six) hours as needed for moderate pain or severe pain. 04/14/16   Tresea MallGledhill, Jane, CNM  norethindrone (AYGESTIN) 5 MG tablet Take 1 tablet (5 mg total) by mouth daily. 04/26/16   Tresea MallGledhill, Jane, CNM  Prenat-FeFum-FePo-FA-DHA w/o A (PROVIDA DHA) 16-16-1.25-110 MG CAPS Take 1 capsule by mouth daily at 12 noon.    [provider]  Tdap (BOOSTRIX) 5-2.5-18.5 LF-MCG/0.5 injection Inject 0.5 mLs into the muscle once. 04/14/16   Tresea MallGledhill, Jane, CNM    Allergies Patient has no known allergies.  No family history on  file.  Social History Social History   Tobacco Use  . Smoking status: Current Every Day Smoker    Packs/day: 0.25    Years: 1.50    Pack years: 0.37    Types: Cigarettes  . Smokeless tobacco: Never Used  Substance Use Topics  . Alcohol use: No  . Drug use: No    Review of Systems Constitutional: Well appearing. Respiratory: Negative for dyspnea. Cardiovascular: Negative for change in skin temperature or color. Musculoskeletal:   Negative for chronic steroid use   Negative for trauma in the presence of osteoporosis  Negative for age over 3250 and trauma.  Negative for constitutional symptoms, or history of cancer  Negative for pain worse at night. Skin: Negative for rash, lesion, or wound.  Genitourinary: Negative for urinary retention. Rectal: Negative for fecal incontinence or new onset constipation/bowel habit changes. Hematological/Immunilogical: Negative for immunosuppression, IV drug use, or fever Neurological: Negative for burning, tingling, numb, electric, radiating pain in the lower extremities.                        Negative for saddle anesthesia.                        Negative for focal neurologic deficit, progressive or disabling symptoms             Negative for saddle anesthesia. ____________________________________________   PHYSICAL EXAM:  VITAL SIGNS: ED Triage Vitals  Enc Vitals Group     BP 06/27/19 0649 117/75  Pulse Rate 06/27/19 0649 82     Resp 06/27/19 0649 18     Temp 06/27/19 0649 97.8 F (36.6 C)     Temp Source 06/27/19 0649 Oral     SpO2 06/27/19 0649 99 %     Weight --      Height --      Head Circumference --      Peak Flow --      Pain Score 06/27/19 0650 10     Pain Loc --      Pain Edu? --      Excl. in [place]? --     Constitutional: Alert and oriented. Well appearing and in no acute distress. Eyes: Conjunctivae are clear without discharge or drainage.  Head: Atraumatic. Neck: Full, active range of motion. Respiratory:  Respirations even and unlabored. Musculoskeletal: Full ROM of the back and extremities, Strength 5/5 of the lower extremities as tested. Neurologic: Reflexes of the lower extremities are 2+. Negative straight leg raise on the right and left side. Skin: Atraumatic.  Psychiatric: Hypersensitive to light touch over all areas of back ____________________________________________   LABS (all labs ordered are listed, but only abnormal results are displayed)  Labs Reviewed - No data to display ____________________________________________  RADIOLOGY  Chest and thoracic images are negative for acute findings. ____________________________________________   PROCEDURES  Procedure(s) performed:  Procedures ____________________________________________   INITIAL IMPRESSION / ASSESSMENT AND PLAN / ED COURSE  Anne Rogers is a 23 y.o. female who presents to the ER for back pain that has been present for the past 6 months. She has not been evaluated for this pain. She denies injury or radicular pain. Tylenol has not helped. She also complains of pain with deep breath. This has been present for several weeks, but worse today. Exam is benign. Toradol and valium ordered as I suspect there is a degree of anxiety that is causing the pain that occurs with deep breath. She is tearful.  PERC negative. No swelling of or pain in the lower extremities.  ----------------------------------------- 7:26 AM on 06/27/2019 -----------------------------------------  Patient observed ambulating to bathroom without assistance.  ----------------------------------------- 8:46 AM on 06/27/2019 -----------------------------------------  Patient sleeping upon entering room. Awakens to voice. Pain has improved. She will be discharged home with prescriptions for toradol and flexeril. She is to follow up with primary care if not improving over the next few days. She is to return to the ER for symptoms that change or  worsen if unable to schedule an appointment.  Medications - No data to display  ED Discharge Orders    None       Pertinent labs & imaging results that were available during my care of the patient were reviewed by me and considered in my medical decision making (see chart for details).  _________________________________________   FINAL CLINICAL IMPRESSION(S) / ED DIAGNOSES  Final diagnoses:  None     If controlled substance prescribed during this visit, 12 month history viewed on the [place] prior to issuing an initial prescription for Schedule II or III opiod.   Victorino Dike, FNP 06/27/19 0814    Schuyler Amor, MD 06/27/19 [phone]

## 2019-06-27 NOTE — ED Notes (Signed)
Urine pregnancy NEGATIVE.  

## 2019-06-27 NOTE — ED Notes (Signed)
See triage note  Presents with pain to mid back  Pain increases with movement and breathing  States she has had intermittent pain for the past 6 months but pain became worse this am

## 2019-06-27 NOTE — Discharge Instructions (Addendum)
Follow up with the primary care provider of your choice for symptoms that are not improving over the week °Return to the ER for symptoms that change or worsen if unable to schedule an appointment. °

## 2019-07-05 ENCOUNTER — Other Ambulatory Visit: Payer: Self-pay

## 2019-07-05 ENCOUNTER — Encounter: Payer: Self-pay | Admitting: Emergency Medicine

## 2019-07-05 ENCOUNTER — Emergency Department: Payer: Medicaid Other

## 2019-07-05 ENCOUNTER — Emergency Department
Admission: EM | Admit: 2019-07-05 | Discharge: 2019-07-05 | Disposition: A | Discharge status: Home / Self Care | Payer: Medicaid Other | Attending: Emergency Medicine | Admitting: Emergency Medicine

## 2019-07-05 DIAGNOSIS — G8929 Other chronic pain: Secondary | ICD-10-CM | POA: Insufficient documentation

## 2019-07-05 DIAGNOSIS — Z79899 Other long term (current) drug therapy: Secondary | ICD-10-CM | POA: Diagnosis not present

## 2019-07-05 DIAGNOSIS — N3000 Acute cystitis without hematuria: Secondary | ICD-10-CM | POA: Insufficient documentation

## 2019-07-05 DIAGNOSIS — F17210 Nicotine dependence, cigarettes, uncomplicated: Secondary | ICD-10-CM | POA: Diagnosis not present

## 2019-07-05 DIAGNOSIS — M545 Low back pain, unspecified: Secondary | ICD-10-CM

## 2019-07-05 LAB — URINALYSIS, COMPLETE (UACMP) WITH MICROSCOPIC
Bilirubin Urine: NEGATIVE
Glucose, UA: NEGATIVE mg/dL
Hgb urine dipstick: NEGATIVE
Ketones, ur: NEGATIVE mg/dL
Nitrite: NEGATIVE
Protein, ur: NEGATIVE mg/dL
Specific Gravity, Urine: 1.026 (ref 1.005–1.030)
pH: 5 (ref 5.0–8.0)

## 2019-07-05 LAB — POCT PREGNANCY, URINE: Preg Test, Ur: NEGATIVE

## 2019-07-05 MED ORDER — LIDOCAINE 5 % EX PTCH
1.0000 | MEDICATED_PATCH | CUTANEOUS | Status: DC
  Administered 2019-07-05: 1 via TRANSDERMAL
  Filled 2019-07-05: qty 1

## 2019-07-05 MED ORDER — OXYCODONE-ACETAMINOPHEN 5-325 MG PO TABS
1.0000 | ORAL_TABLET | Freq: Once | ORAL | Status: AC
  Administered 2019-07-05: 22:00:00 1 via ORAL
  Filled 2019-07-05: qty 1

## 2019-07-05 MED ORDER — MELOXICAM 7.5 MG PO TABS: 7.5000 mg | ORAL_TABLET | Freq: Every day | ORAL | 0 refills | Status: DC

## 2019-07-05 MED ORDER — BACLOFEN 10 MG PO TABS
10.0000 mg | ORAL_TABLET | Freq: Three times a day (TID) | ORAL | Status: DC
  Administered 2019-07-05: 10 mg via ORAL
  Filled 2019-07-05: qty 1

## 2019-07-05 MED ORDER — TRAMADOL HCL 50 MG PO TABS: 50.0000 mg | ORAL_TABLET | Freq: Four times a day (QID) | ORAL | 0 refills | Status: DC | PRN

## 2019-07-05 MED ORDER — LIDOCAINE 5 % EX PTCH: 1.0000 | MEDICATED_PATCH | CUTANEOUS | 0 refills | Status: DC

## 2019-07-05 MED ORDER — MELOXICAM 7.5 MG PO TABS
15.0000 mg | ORAL_TABLET | Freq: Once | ORAL | Status: AC
  Administered 2019-07-05: 15 mg via ORAL
  Filled 2019-07-05: qty 2

## 2019-07-05 MED ORDER — BACLOFEN 10 MG PO TABS: 5.0000 mg | ORAL_TABLET | Freq: Three times a day (TID) | ORAL | 0 refills | Status: AC

## 2019-07-05 MED ORDER — CEPHALEXIN 500 MG PO CAPS: 500.0000 mg | ORAL_CAPSULE | Freq: Two times a day (BID) | ORAL | 0 refills | Status: AC

## 2019-07-05 NOTE — ED Triage Notes (Signed)
Patient wheelchair to triage with complaints of back pain for the last 6-12 months. Pt reports   constant Sharp pulsating pain in all areas of back that radiates to hips, knees and both temples.  Pt reports pain all the time without relief or change.  Pt reports being here last week for the same and the muscle relaxers not working. Pt denies N/V/D/HA/chills/sweating/fever. Pt reports  taking prescribed muscle relaxer before coming to ED.  Speaking in complete coherent sentences. No acute breathing distress noted. Pt was tachypneic at first presentation but is no longer.

## 2019-07-05 NOTE — ED Provider Notes (Signed)
[facility] Emergency Department Provider Note  ____________________________________________  Time seen: Approximately 11:12 PM  I have reviewed the triage vital signs and the nursing notes.   HISTORY  Chief Complaint Back Pain    HPI Anne Rogers is a 23 y.o. female that presents emergency department for evaluation of low back pain that radiates into bilateral hips for over 6 months.  Pain has been worse over the last couple of days.  Patient was seen for upper back pain in the emergency department 8 days ago.  She denies any upper back pain currently.  She states that the muscle relaxers only make her tired but she still has the pain.  No trauma.  No IV drug use.  She has not followed up with a specialist.  No bowel or bladder dysfunction or saddle anesthesias.  No fever, shortness of breath, chest pain, abdominal pain, dysuria, vaginal discharge, weakness.   Past Medical History:  Diagnosis Date  . Gall stones     Patient Active Problem List   Diagnosis Date Noted  . Postpartum care following vaginal delivery 04/14/2016  . Labor and delivery indication for care or intervention 04/11/2016  . Labor and delivery, indication for care 04/09/2016  . Gall stones     Past Surgical History:  Procedure Laterality Date  . ABDOMINAL SURGERY    . INDUCED ABORTION  02/2015    Prior to Admission medications   Medication Sig Start Date End Date Taking? Authorizing Provider  aspirin-acetaminophen-caffeine (EXCEDRIN MIGRAINE) [phone] MG per tablet Take by mouth every 6 (six) hours as needed for headache. Reported on 04/11/2016    [provider]  baclofen (LIORESAL) 10 MG tablet Take 0.5 tablets (5 mg total) by mouth 3 (three) times daily for 10 days. 07/05/19 07/15/19  Laban Emperor, PA-C  cephALEXin (KEFLEX) 500 MG capsule Take 1 capsule (500 mg total) by mouth 2 (two) times daily for 7 days. 07/05/19 07/12/19  Laban Emperor, PA-C  cyclobenzaprine  (FLEXERIL) 10 MG tablet Take 1 tablet (10 mg total) by mouth 3 (three) times daily as needed. 06/27/19   Triplett, Johnette Abraham B, FNP  ketorolac (TORADOL) 10 MG tablet Take 1 tablet (10 mg total) by mouth every 6 (six) hours as needed. 06/27/19   Triplett, Cari B, FNP  lidocaine (LIDODERM) 5 % Place 1 patch onto the skin daily. Remove & Discard patch within 12 hours or as directed by MD 07/05/19   Laban Emperor, PA-C  meloxicam (MOBIC) 7.5 MG tablet Take 1 tablet (7.5 mg total) by mouth daily. 07/05/19 07/04/20  Laban Emperor, PA-C  norethindrone (AYGESTIN) 5 MG tablet Take 1 tablet (5 mg total) by mouth daily. 04/26/16   Rod Can, CNM  traMADol (ULTRAM) 50 MG tablet Take 1 tablet (50 mg total) by mouth every 6 (six) hours as needed. 07/05/19 07/04/20  Laban Emperor, PA-C    Allergies Patient has no known allergies.  History reviewed. No pertinent family history.  Social History Social History   Tobacco Use  . Smoking status: Current Every Day Smoker    Packs/day: 1.00    Years: 1.50    Pack years: 1.50    Types: Cigarettes  . Smokeless tobacco: Never Used  Substance Use Topics  . Alcohol use: No  . Drug use: No     Review of Systems  Constitutional: No fever/chills Respiratory: No SOB. Gastrointestinal: No abdominal pain.  No nausea, no vomiting.  Genitourinary: Negative for dysuria. Musculoskeletal: Positive for low back pain. Skin:  Negative for rash, abrasions, lacerations, ecchymosis. Neurological: Negative for headaches, numbness or tingling   ____________________________________________   PHYSICAL EXAM:  VITAL SIGNS: ED Triage Vitals  Enc Vitals Group     BP 07/05/19 2020 135/87     Pulse Rate 07/05/19 2020 98     Resp 07/05/19 2020 20     Temp 07/05/19 2020 98.3 F (36.8 C)     Temp Source 07/05/19 2020 Oral     SpO2 07/05/19 2020 98 %     Weight 07/05/19 2021 250 lb (113.4 kg)     Height 07/05/19 2021 5\' 3"  (1.6 m)     Head Circumference --      Peak Flow --       Pain Score 07/05/19 2020 10     Pain Loc --      Pain Edu? --      Excl. in [place]? --      Constitutional: Alert and oriented. Well appearing and in no acute distress. Eyes: Conjunctivae are normal. PERRL. EOMI. Head: Atraumatic. ENT:      Ears:      Nose: No congestion/rhinnorhea.      Mouth/Throat: Mucous membranes are moist.  Neck: No stridor.  Cardiovascular: Normal rate, regular rhythm.  Good peripheral circulation. Respiratory: Normal respiratory effort without tachypnea or retractions. Lungs CTAB. Good air entry to the bases with no decreased or absent breath sounds. Gastrointestinal: Bowel sounds 4 quadrants. Soft and nontender to palpation. No guarding or rigidity. No palpable masses. No distention. No CVA tenderness. Musculoskeletal: Full range of motion to all extremities. No gross deformities appreciated.  Diffuse tenderness to palpation to lumbar spine and lumbar paraspinal muscles.  Strength equal in lower extremities bilaterally. Neurologic:  Normal speech and language. No gross focal neurologic deficits are appreciated.  Skin:  Skin is warm, dry and intact. No rash noted. Psychiatric: Mood and affect are normal. Speech and behavior are normal. Patient exhibits appropriate insight and judgement.   ____________________________________________   LABS (all labs ordered are listed, but only abnormal results are displayed)  Labs Reviewed  URINALYSIS, COMPLETE (UACMP) WITH MICROSCOPIC - Abnormal; Notable for the following components:      Result Value   Color, Urine YELLOW (*)    APPearance HAZY (*)    Leukocytes,Ua MODERATE (*)    Bacteria, UA RARE (*)    All other components within normal limits  POC URINE PREG, ED  POCT PREGNANCY, URINE   ____________________________________________  EKG   ____________________________________________  RADIOLOGY Lexine BatonI, Keelyn Fjelstad, personally viewed and evaluated these images (plain radiographs) as part of my medical  decision making, as well as reviewing the written report by the radiologist.  Dg Lumbar Spine 2-3 Views  Result Date: 07/05/2019 CLINICAL DATA:  23 year old female with back pain for 6-12 months. EXAM: LUMBAR SPINE - 2-3 VIEW COMPARISON:  Thoracic radiographs 06/27/2019. FINDINGS: Normal lumbar segmentation. Preserved lumbar lordosis and disc spaces. Minimal retrolisthesis of L5 on S1. Bone mineralization is within normal limits. The visible lower thoracic levels appear intact. Sacral ala and SI joints appear normal. Negative visible abdominal and pelvic visceral contours. IMPRESSION: Negative radiographic appearance of the lumbar spine. Electronically Signed   By: Odessa FlemingH  Hall M.D.   On: 07/05/2019 22:51    ____________________________________________    PROCEDURES  Procedure(s) performed:    Procedures    Medications  lidocaine (LIDODERM) 5 % 1 patch (1 patch Transdermal Patch Applied 07/05/19 2214)  baclofen (LIORESAL) tablet 10 mg (has no administration in time  range)  meloxicam (MOBIC) tablet 15 mg (has no administration in time range)  oxyCODONE-acetaminophen (PERCOCET/ROXICET) 5-325 MG per tablet 1 tablet (1 tablet Oral Given 07/05/19 2214)     ____________________________________________   INITIAL IMPRESSION / ASSESSMENT AND PLAN / ED COURSE  Pertinent labs & imaging results that were available during my care of the patient were reviewed by me and considered in my medical decision making (see chart for details).  Review of the [place] CSRS was performed in accordance of the [facility] prior to dispensing any controlled drugs.     Patient presented to emergency department for evaluation of acute on chronic low back pain.  Vital signs and exam are reassuring.  Exam is overall unremarkable.  X-ray negative for acute bony abnormalities.  Urinalysis consistent with cystitis.  Patient will be discharged home with prescriptions for Keflex, baclofen, Mobic, short course of Percocet. Patient is  to follow up with orthopedics as directed.  Referral was given.  Patient is given ED precautions to return to the ED for any worsening or new symptoms.  Faduma Raeanne BarryM Leeth was evaluated in Emergency Department on 07/05/2019 for the symptoms described in the history of present illness. She was evaluated in the context of the global COVID-19 pandemic, which necessitated consideration that the patient might be at risk for infection with the SARS-CoV-2 virus that causes COVID-19. Institutional protocols and algorithms that pertain to the evaluation of patients at risk for COVID-19 are in a state of rapid change based on information released by regulatory bodies including the CDC and federal and state organizations. These policies and algorithms were followed during the patient's care in the ED.   ____________________________________________  FINAL CLINICAL IMPRESSION(S) / ED DIAGNOSES  Final diagnoses:  Chronic bilateral low back pain, unspecified whether sciatica present  Acute cystitis without hematuria      NEW MEDICATIONS STARTED DURING THIS VISIT:  ED Discharge Orders         Ordered    meloxicam (MOBIC) 7.5 MG tablet  Daily     07/05/19 2329    baclofen (LIORESAL) 10 MG tablet  3 times daily     07/05/19 2329    traMADol (ULTRAM) 50 MG tablet  Every 6 hours PRN     07/05/19 2329    lidocaine (LIDODERM) 5 %  Every 24 hours     07/05/19 2329    cephALEXin (KEFLEX) 500 MG capsule  2 times daily     07/05/19 2329              This chart was dictated using voice recognition software/Dragon. Despite best efforts to proofread, errors can occur which can change the meaning. Any change was purely unintentional.    Enid DerryWagner, Talissa Apple, PA-C 07/06/19 0010    Sharman CheekStafford, Phillip, MD 07/10/19 [phone]

## 2019-07-13 ENCOUNTER — Emergency Department
Admission: EM | Admit: 2019-07-13 | Discharge: 2019-07-13 | Disposition: A | Discharge status: Home / Self Care | Payer: Medicaid Other | Attending: Emergency Medicine | Admitting: Emergency Medicine

## 2019-07-13 ENCOUNTER — Other Ambulatory Visit: Payer: Self-pay

## 2019-07-13 ENCOUNTER — Encounter: Payer: Self-pay | Admitting: Emergency Medicine

## 2019-07-13 DIAGNOSIS — R55 Syncope and collapse: Secondary | ICD-10-CM | POA: Diagnosis not present

## 2019-07-13 DIAGNOSIS — R51 Headache: Secondary | ICD-10-CM | POA: Diagnosis not present

## 2019-07-13 DIAGNOSIS — G8929 Other chronic pain: Secondary | ICD-10-CM | POA: Insufficient documentation

## 2019-07-13 DIAGNOSIS — F17210 Nicotine dependence, cigarettes, uncomplicated: Secondary | ICD-10-CM | POA: Insufficient documentation

## 2019-07-13 DIAGNOSIS — M542 Cervicalgia: Secondary | ICD-10-CM | POA: Diagnosis present

## 2019-07-13 LAB — URINALYSIS, COMPLETE (UACMP) WITH MICROSCOPIC
Bacteria, UA: NONE SEEN
Bilirubin Urine: NEGATIVE
Glucose, UA: NEGATIVE mg/dL
Hgb urine dipstick: NEGATIVE
Ketones, ur: NEGATIVE mg/dL
Nitrite: NEGATIVE
Protein, ur: NEGATIVE mg/dL
Specific Gravity, Urine: 1.014 (ref 1.005–1.030)
pH: 6 (ref 5.0–8.0)

## 2019-07-13 LAB — CBC
HCT: 39.3 % (ref 36.0–46.0)
Hemoglobin: 13.2 g/dL (ref 12.0–15.0)
MCH: 30.1 pg (ref 26.0–34.0)
MCHC: 33.6 g/dL (ref 30.0–36.0)
MCV: 89.5 fL (ref 80.0–100.0)
Platelets: 253 10*3/uL (ref 150–400)
RBC: 4.39 MIL/uL (ref 3.87–5.11)
RDW: 12.8 % (ref 11.5–15.5)
WBC: 8.8 10*3/uL (ref 4.0–10.5)
nRBC: 0 % (ref 0.0–0.2)

## 2019-07-13 LAB — POCT PREGNANCY, URINE: Preg Test, Ur: NEGATIVE

## 2019-07-13 LAB — BASIC METABOLIC PANEL
Anion gap: 6 (ref 5–15)
BUN: 9 mg/dL (ref 6–20)
CO2: 23 mmol/L (ref 22–32)
Calcium: 9 mg/dL (ref 8.9–10.3)
Chloride: 111 mmol/L (ref 98–111)
Creatinine, Ser: 0.61 mg/dL (ref 0.44–1.00)
GFR calc Af Amer: >60 mL/min (ref >60)
GFR calc non Af Amer: >60 mL/min (ref >60)
Glucose, Bld: 91 mg/dL (ref 70–99)
Potassium: 4 mmol/L (ref 3.5–5.1)
Sodium: 140 mmol/L (ref 135–145)

## 2019-07-13 MED ORDER — PREGABALIN 75 MG PO CAPS: 75.0000 mg | ORAL_CAPSULE | Freq: Two times a day (BID) | ORAL | 0 refills | Status: DC

## 2019-07-13 MED ORDER — PREGABALIN 75 MG PO CAPS
75.0000 mg | ORAL_CAPSULE | Freq: Once | ORAL | Status: AC
  Administered 2019-07-13: 75 mg via ORAL
  Filled 2019-07-13: qty 1

## 2019-07-13 NOTE — ED Provider Notes (Signed)
[facility] Emergency Department Provider Note  ____________________________________________  Time seen: Approximately 11:35 PM  I have reviewed the triage vital signs and the nursing notes.   HISTORY  Chief Complaint Back Pain and Loss of Consciousness   HPI Anne Rogers is a 23 y.o. female presents for evaluation of chronic pain.  Patient has been seen in the emergency department several times for chronic pain.  Her pain is in her neck, head, diffuse across her back, bilateral hips and knees.  She reports that the pain is constant for at least a year.  The pain is sharp and sometimes dull.  She reports that she has a hard time sleeping.  She has not seen a primary care doctor or any other specialties other than the emergency department.  She has used the baclofen and meloxicam in the past which usually helps for a day or 2 but then stopped helping.  Patient has a lot of stressors in her personal life.  She is a single mother.  She has been unable to find a job due to [employer].  She shares a home with a couple who has 2 other kids who according to the patient believe her 976-year-old child.  Patient feels stressed and depressed.  She denies SI or HI.  She denies any changes in her chronic pain.  She denies chest pain or shortness of breath, cough or fever, abdominal pain.  Past Medical History:  Diagnosis Date  . Gall stones     Patient Active Problem List   Diagnosis Date Noted  . Postpartum care following vaginal delivery 04/14/2016  . Labor and delivery indication for care or intervention 04/11/2016  . Labor and delivery, indication for care 04/09/2016  . Gall stones     Past Surgical History:  Procedure Laterality Date  . ABDOMINAL SURGERY    . INDUCED ABORTION  02/2015  . INDUCED ABORTION     x 2  . INDUCED ABORTION     x 3    Prior to Admission medications   Medication Sig Start Date End Date Taking? Authorizing Provider  baclofen (LIORESAL)  10 MG tablet Take 0.5 tablets (5 mg total) by mouth 3 (three) times daily for 10 days. 07/05/19 07/15/19 Yes Enid DerryWagner, Ashley, PA-C  meloxicam (MOBIC) 7.5 MG tablet Take 1 tablet (7.5 mg total) by mouth daily. 07/05/19 07/04/20 Yes Enid DerryWagner, Ashley, PA-C  lidocaine (LIDODERM) 5 % Place 1 patch onto the skin daily. Remove & Discard patch within 12 hours or as directed by MD 07/05/19   Enid DerryWagner, Ashley, PA-C  pregabalin (LYRICA) 75 MG capsule Take 1 capsule (75 mg total) by mouth 2 (two) times daily. 07/13/19 07/12/20  Nita SickleVeronese, [place], MD    Allergies Patient has no known allergies.  History reviewed. No pertinent family history.  Social History Social History   Tobacco Use  . Smoking status: Current Every Day Smoker    Packs/day: 1.00    Years: 1.50    Pack years: 1.50    Types: Cigarettes  . Smokeless tobacco: Never Used  Substance Use Topics  . Alcohol use: No  . Drug use: No    Review of Systems  Constitutional: Negative for fever. Eyes: Negative for visual changes. ENT: Negative for sore throat. Neck: No neck pain  Cardiovascular: Negative for chest pain. Respiratory: Negative for shortness of breath. Gastrointestinal: Negative for abdominal pain, vomiting or diarrhea. Genitourinary: Negative for dysuria. Musculoskeletal: + chronic back, neck, head, hips, and knee pain Skin:  Negative for rash. Neurological: Negative for headaches, weakness or numbness. Psych: No SI or HI  ____________________________________________   PHYSICAL EXAM:  VITAL SIGNS: ED Triage Vitals  Enc Vitals Group     BP 07/13/19 1946 124/77     Pulse Rate 07/13/19 1946 78     Resp 07/13/19 1946 19     Temp 07/13/19 1946 98.2 F (36.8 C)     Temp Source 07/13/19 1946 Oral     SpO2 07/13/19 1946 99 %     Weight 07/13/19 1948 250 lb (113.4 kg)     Height 07/13/19 1948 5\' 3"  (1.6 m)     Head Circumference --      Peak Flow --      Pain Score 07/13/19 1948 10     Pain Loc --      Pain Edu? --       Excl. in [place]? --     Constitutional: Alert and oriented. Well appearing and in no apparent distress. HEENT:      Head: Normocephalic and atraumatic.         Eyes: Conjunctivae are normal. Sclera is non-icteric.       Mouth/Throat: Mucous membranes are moist.       Neck: Supple with no signs of meningismus. Cardiovascular: Regular rate and rhythm. No murmurs, gallops, or rubs. 2+ symmetrical distal pulses are present in all extremities. No JVD. Respiratory: Normal respiratory effort. Lungs are clear to auscultation bilaterally. No wheezes, crackles, or rhonchi.  Gastrointestinal: Soft, non tender, and non distended with positive bowel sounds. No rebound or guarding. Musculoskeletal: Nontender with normal range of motion in all extremities. No edema, cyanosis, or erythema of extremities. No midline tenderness to palpation Neurologic: Normal speech and language. Face is symmetric. Moving all extremities. No gross focal neurologic deficits are appreciated. Skin: Skin is warm, dry and intact. No rash noted. Psychiatric: Mood and affect are normal. Speech and behavior are normal.  ____________________________________________   LABS (all labs ordered are listed, but only abnormal results are displayed)  Labs Reviewed  URINALYSIS, COMPLETE (UACMP) WITH MICROSCOPIC - Abnormal; Notable for the following components:      Result Value   Color, Urine YELLOW (*)    APPearance CLEAR (*)    Leukocytes,Ua TRACE (*)    All other components within normal limits  BASIC METABOLIC PANEL  CBC  POCT PREGNANCY, URINE  POC URINE PREG, ED   ____________________________________________  EKG  ED ECG REPORT I, Rudene Re, the attending physician, personally viewed and interpreted this ECG.  Normal sinus rhythm, rate of 82, normal intervals, normal axis, no ST elevations or depressions. ____________________________________________  RADIOLOGY  none  ____________________________________________    PROCEDURES  Procedure(s) performed: None Procedures Critical Care performed:  None ____________________________________________   INITIAL IMPRESSION / ASSESSMENT AND PLAN / ED COURSE   23 y.o. female presents for evaluation of chronic pain x 1 year.  Further description of patient's pain and the fact the patient has several stressors and seems to be depressed currently I am concerned patient might have fibromyalgia.  Her exam is completely benign with no joint swelling or erythema, full painless range of motion in all her joints.  She has been seen several times in the emergency room but has not been seen by PCP.  She is currently does not have a primary care doctor.  She does have insurance with Medicaid.  I will refer her to [facility] to establish care.  I will start patient on  Lyrica for concerns of fibromyalgia.  Her labs and urinalysis are with no acute findings.  Discussed my standard return precautions.      As part of my medical decision making, I reviewed the following data within the electronic MEDICAL RECORD NUMBER Nursing notes reviewed and incorporated, Labs reviewed , EKG interpreted , Old chart reviewed, Notes from prior ED visits and [place] Controlled Substance Database    Pertinent labs & imaging results that were available during my care of the patient were reviewed by me and considered in my medical decision making (see chart for details).    ____________________________________________   FINAL CLINICAL IMPRESSION(S) / ED DIAGNOSES  Final diagnoses:  Other chronic pain      NEW MEDICATIONS STARTED DURING THIS VISIT:  ED Discharge Orders         Ordered    pregabalin (LYRICA) 75 MG capsule  2 times daily     07/13/19 2329           Note:  This document was prepared using Dragon voice recognition software and may include unintentional dictation errors.    Don PerkingVeronese, [place], MD 07/13/19 [phone]

## 2019-07-13 NOTE — ED Notes (Signed)
Patient states she is going outside to smoke.

## 2019-07-13 NOTE — ED Notes (Signed)
Pt says Tramadol and Toradol didn't work for her pain after 2 days;

## 2019-07-13 NOTE — ED Notes (Signed)
Pt ambulated to stat registration to say the pain is worse and she's feeling dizzy; assisted back to wheelchair; waiting for treatment room

## 2019-07-13 NOTE — ED Triage Notes (Addendum)
Pt reports pain to her entire back for 6-12 months; has been seen here multiple times for same; says whomever she was referred to months ago never answers the phone so she's quit trying; denies urinary s/s; pt says she's having difficulty sleeping due to pain; says when she was here on 7/10 she was prescribed a muscle relaxer and tramadol, as well as antibiotic for UTI; pt says medication helped pain for 2 days but then stopped; has finished her antibiotic; pt adds earlier today she got so nauseated and dizzy that she passed out;

## 2019-07-13 NOTE — ED Notes (Signed)
ED Provider at bedside. 

## 2020-03-22 ENCOUNTER — Encounter: Payer: Self-pay | Admitting: Emergency Medicine

## 2020-03-22 ENCOUNTER — Emergency Department
Admission: EM | Admit: 2020-03-22 | Discharge: 2020-03-22 | Disposition: A | Discharge status: Home / Self Care | Payer: Medicaid Other | Attending: Emergency Medicine | Admitting: Emergency Medicine

## 2020-03-22 ENCOUNTER — Other Ambulatory Visit: Payer: Self-pay

## 2020-03-22 DIAGNOSIS — J029 Acute pharyngitis, unspecified: Secondary | ICD-10-CM | POA: Diagnosis present

## 2020-03-22 DIAGNOSIS — J020 Streptococcal pharyngitis: Secondary | ICD-10-CM | POA: Diagnosis not present

## 2020-03-22 DIAGNOSIS — Z79899 Other long term (current) drug therapy: Secondary | ICD-10-CM | POA: Insufficient documentation

## 2020-03-22 DIAGNOSIS — F17210 Nicotine dependence, cigarettes, uncomplicated: Secondary | ICD-10-CM | POA: Insufficient documentation

## 2020-03-22 LAB — CBC
HCT: 39.8 % (ref 36.0–46.0)
Hemoglobin: 13.7 g/dL (ref 12.0–15.0)
MCH: 30.6 pg (ref 26.0–34.0)
MCHC: 34.4 g/dL (ref 30.0–36.0)
MCV: 89 fL (ref 80.0–100.0)
Platelets: 251 10*3/uL (ref 150–400)
RBC: 4.47 MIL/uL (ref 3.87–5.11)
RDW: 12.5 % (ref 11.5–15.5)
WBC: 12.3 10*3/uL — ABNORMAL HIGH (ref 4.0–10.5)
nRBC: 0 % (ref 0.0–0.2)

## 2020-03-22 LAB — GROUP A STREP BY PCR: Group A Strep by PCR: DETECTED — AB

## 2020-03-22 LAB — MONONUCLEOSIS SCREEN: Mono Screen: NEGATIVE

## 2020-03-22 MED ORDER — AMOXICILLIN 500 MG PO CAPS: 500.0000 mg | ORAL_CAPSULE | Freq: Three times a day (TID) | ORAL | 0 refills | Status: DC

## 2020-03-22 MED ORDER — DEXAMETHASONE SODIUM PHOSPHATE 10 MG/ML IJ SOLN
10.0000 mg | Freq: Once | INTRAMUSCULAR | Status: AC
  Administered 2020-03-22: 10 mg via INTRAMUSCULAR
  Filled 2020-03-22: qty 1

## 2020-03-22 MED ORDER — PREDNISONE 10 MG (21) PO TBPK: ORAL_TABLET | ORAL | 0 refills | Status: DC

## 2020-03-22 NOTE — Discharge Instructions (Signed)
Follow-up with your regular doctor if not improving in 3 days.  Return emergency department worsening.  Take medication as prescribed.  You are contagious until you have had 24 hours of antibiotics.

## 2020-03-22 NOTE — ED Triage Notes (Signed)
Pt c/o sore throat since Friday, c/o dry cough, denies fevers, states painful in throat to take deep breath, pain when swallowing. No distress noted in triage.

## 2020-03-22 NOTE — ED Provider Notes (Signed)
[facility] Emergency Department Provider Note  ____________________________________________   First MD Initiated Contact with Patient 03/22/20 1243     (approximate)  I have reviewed the triage vital signs and the nursing notes.   HISTORY  Chief Complaint Sore Throat    HPI Anne Rogers is a 24 y.o. female presents emergency department complaining of sore throat for 2 days.  She is also had a dry cough.  No fever or chills.  No known Covid exposure.  States it is painful to swallow.  She retains her sense of taste and smell.    Past Medical History:  Diagnosis Date  . Gall stones     Patient Active Problem List   Diagnosis Date Noted  . Postpartum care following vaginal delivery 04/14/2016  . Labor and delivery indication for care or intervention 04/11/2016  . Labor and delivery, indication for care 04/09/2016  . Gall stones     Past Surgical History:  Procedure Laterality Date  . ABDOMINAL SURGERY    . INDUCED ABORTION  02/2015  . INDUCED ABORTION     x 2  . INDUCED ABORTION     x 3    Prior to Admission medications   Medication Sig Start Date End Date Taking? Authorizing Provider  amoxicillin (AMOXIL) 500 MG capsule Take 1 capsule (500 mg total) by mouth 3 (three) times daily. 03/22/20   Alichia Alridge, Linden Dolin, PA-C  predniSONE (STERAPRED UNI-PAK 21 TAB) 10 MG (21) TBPK tablet Take 6 pills on day one then decrease by 1 pill each day 03/22/20   Versie Starks, PA-C  pregabalin (LYRICA) 75 MG capsule Take 1 capsule (75 mg total) by mouth 2 (two) times daily. 07/13/19 07/12/20  Rudene Re, MD    Allergies Patient has no known allergies.  History reviewed. No pertinent family history.  Social History Social History   Tobacco Use  . Smoking status: Current Every Day Smoker    Packs/day: 1.00    Years: 1.50    Pack years: 1.50    Types: Cigarettes  . Smokeless tobacco: Never Used  Substance Use Topics  . Alcohol use: No  .  Drug use: No    Review of Systems  Constitutional: No fever/chills Eyes: No visual changes. ENT: Positive sore throat. Respiratory: Positive cough Cardiovascular: Denies chest pain Gastrointestinal: Denies abdominal pain Genitourinary: Negative for dysuria. Musculoskeletal: Negative for back pain. Skin: Negative for rash. Psychiatric: no mood changes,     ____________________________________________   PHYSICAL EXAM:  VITAL SIGNS: ED Triage Vitals  Enc Vitals Group     BP 03/22/20 1229 104/70     Pulse Rate 03/22/20 1229 92     Resp 03/22/20 1229 20     Temp 03/22/20 1229 98.4 F (36.9 C)     Temp Source 03/22/20 1229 Oral     SpO2 03/22/20 1229 98 %     Weight 03/22/20 1227 250 lb (113.4 kg)     Height 03/22/20 1227 5\' 3"  (1.6 m)     Head Circumference --      Peak Flow --      Pain Score 03/22/20 1227 9     Pain Loc --      Pain Edu? --      Excl. in [place]? --     Constitutional: Alert and oriented. Well appearing and in no acute distress. Eyes: Conjunctivae are normal.  Head: Atraumatic. Nose: No congestion/rhinnorhea. Mouth/Throat: Mucous membranes are moist.  Throat is bright  red tonsils are grossly swollen and almost kissing, white exudate noted Neck:  supple no lymphadenopathy noted Cardiovascular: Normal rate, regular rhythm. Heart sounds are normal Respiratory: Normal respiratory effort.  No retractions, lungs c t a  GU: deferred Musculoskeletal: FROM all extremities, warm and well perfused Neurologic:  Normal speech and language.  Skin:  Skin is warm, dry and intact. No rash noted. Psychiatric: Mood and affect are normal. Speech and behavior are normal.  ____________________________________________   LABS (all labs ordered are listed, but only abnormal results are displayed)  Labs Reviewed  GROUP A STREP BY PCR - Abnormal; Notable for the following components:      Result Value   Group A Strep by PCR DETECTED (*)    All other components within  normal limits  CBC - Abnormal; Notable for the following components:   WBC 12.3 (*)    All other components within normal limits  MONONUCLEOSIS SCREEN   ____________________________________________   ____________________________________________  RADIOLOGY    ____________________________________________   PROCEDURES  Procedure(s) performed: Decadron 10 mg IM   Procedures    ____________________________________________   INITIAL IMPRESSION / ASSESSMENT AND PLAN / ED COURSE  Pertinent labs & imaging results that were available during my care of the patient were reviewed by me and considered in my medical decision making (see chart for details).   Patient is 24 year old female presents emergency department with sore throat and cough.  No Covid exposures.  No fever chills.  See HPI  Physical exam shows patient to appear well.  Throat is bright red with swollen tonsils that are almost kissing and some exudate.  Remainder of the exam is unremarkable  DDx: Tonsillitis, strep, mono  CBC has elevated WBC of 12.3, mono test is negative, strep test is positive  Explained to patient that she has strep throat.  Due to mouth swelling I did start her on steroid pack in addition to the Decadron injection.  She is to take amoxicillin.  If she is unable to swallow liquids she is to return to the emergency department.  States she understands and will comply.  Is discharged stable condition.    Anne Rogers was evaluated in Emergency Department on 03/22/2020 for the symptoms described in the history of present illness. She was evaluated in the context of the global COVID-19 pandemic, which necessitated consideration that the patient might be at risk for infection with the SARS-CoV-2 virus that causes COVID-19. Institutional protocols and algorithms that pertain to the evaluation of patients at risk for COVID-19 are in a state of rapid change based on information released by regulatory bodies  including the CDC and federal and state organizations. These policies and algorithms were followed during the patient's care in the ED.   As part of my medical decision making, I reviewed the following data within the electronic MEDICAL RECORD NUMBER Nursing notes reviewed and incorporated, Labs reviewed , Old chart reviewed, Notes from prior ED visits and [place] Controlled Substance Database  ____________________________________________   FINAL CLINICAL IMPRESSION(S) / ED DIAGNOSES  Final diagnoses:  Acute streptococcal pharyngitis      NEW MEDICATIONS STARTED DURING THIS VISIT:  Discharge Medication List as of 03/22/2020  2:43 PM    START taking these medications   Details  amoxicillin (AMOXIL) 500 MG capsule Take 1 capsule (500 mg total) by mouth 3 (three) times daily., Starting Sun 03/22/2020, Normal    predniSONE (STERAPRED UNI-PAK 21 TAB) 10 MG (21) TBPK tablet Take 6 pills on day  one then decrease by 1 pill each day, Normal         Note:  This document was prepared using Dragon voice recognition software and may include unintentional dictation errors.    Faythe Ghee, PA-C 03/22/20 1703    Concha Se, MD 03/23/20 [phone]

## 2020-06-18 ENCOUNTER — Encounter: Payer: Self-pay | Admitting: Advanced Practice Midwife

## 2020-06-18 ENCOUNTER — Ambulatory Visit (INDEPENDENT_AMBULATORY_CARE_PROVIDER_SITE_OTHER): Payer: Medicaid Other | Admitting: Advanced Practice Midwife

## 2020-06-18 ENCOUNTER — Other Ambulatory Visit: Payer: Self-pay

## 2020-06-18 ENCOUNTER — Other Ambulatory Visit (HOSPITAL_COMMUNITY)
Admission: RE | Admit: 2020-06-18 | Discharge: 2020-06-18 | Disposition: A | Discharge status: Home / Self Care | Payer: Medicaid Other | Source: Ambulatory Visit | Attending: Advanced Practice Midwife | Admitting: Advanced Practice Midwife

## 2020-06-18 VITALS — BP 110/73 | HR 85 | Wt 257.0 lb

## 2020-06-18 DIAGNOSIS — O99211 Obesity complicating pregnancy, first trimester: Secondary | ICD-10-CM

## 2020-06-18 DIAGNOSIS — Z124 Encounter for screening for malignant neoplasm of cervix: Secondary | ICD-10-CM | POA: Insufficient documentation

## 2020-06-18 DIAGNOSIS — O99213 Obesity complicating pregnancy, third trimester: Secondary | ICD-10-CM | POA: Insufficient documentation

## 2020-06-18 DIAGNOSIS — O0991 Supervision of high risk pregnancy, unspecified, first trimester: Secondary | ICD-10-CM | POA: Diagnosis not present

## 2020-06-18 DIAGNOSIS — N912 Amenorrhea, unspecified: Secondary | ICD-10-CM | POA: Diagnosis not present

## 2020-06-18 DIAGNOSIS — Z3A01 Less than 8 weeks gestation of pregnancy: Secondary | ICD-10-CM

## 2020-06-18 DIAGNOSIS — Z113 Encounter for screening for infections with a predominantly sexual mode of transmission: Secondary | ICD-10-CM

## 2020-06-18 DIAGNOSIS — O0993 Supervision of high risk pregnancy, unspecified, third trimester: Secondary | ICD-10-CM | POA: Insufficient documentation

## 2020-06-18 LAB — POCT URINE PREGNANCY: Preg Test, Ur: POSITIVE — AB

## 2020-06-18 NOTE — Progress Notes (Signed)
NOB Ready, Set, baby info given

## 2020-06-18 NOTE — Patient Instructions (Signed)
Exercise During Pregnancy Exercise is an important part of being healthy for people of all ages. Exercise improves the function of your heart and lungs and helps you maintain strength, flexibility, and a healthy body weight. Exercise also boosts energy levels and elevates mood. Most women should exercise regularly during pregnancy. In rare cases, women with certain medical conditions or complications may be asked to limit or avoid exercise during pregnancy. How does this affect me? Along with maintaining general strength and flexibility, exercising during pregnancy can help:  Keep strength in muscles that are used during labor and childbirth.  Decrease low back pain.  Reduce symptoms of depression.  Control weight gain during pregnancy.  Reduce the risk of needing insulin if you develop diabetes during pregnancy.  Decrease the risk of cesarean delivery.  Speed up your recovery after giving birth. How does this affect my baby? Exercise can help you have a healthy pregnancy. Exercise does not cause premature birth. It will not cause your baby to weigh less at birth. What exercises can I do? Many exercises are safe for you to do during pregnancy. Do a variety of exercises that safely increase your heart and breathing rates and help you build and maintain muscle strength. Do exercises exactly as told by your health care provider. You may do these exercises:  Walking or hiking.  Swimming.  Water aerobics.  Riding a stationary bike.  Strength training.  Modified yoga or Pilates. Tell your instructor that you are pregnant. Avoid overstretching, and avoid lying on your back for long periods of time.  Running or jogging. Only choose this type of exercise if you: ? Ran or jogged regularly before your pregnancy. ? Can run or jog and still talk in complete sentences. What exercises should I avoid? Depending on your level of fitness and whether you exercised regularly before your  pregnancy, you may be told to limit high-intensity exercise. You can tell that you are exercising at a high intensity if you are breathing much harder and faster and cannot hold a conversation while exercising. You must avoid:  Contact sports.  Activities that put you at risk for falling on or being hit in the belly, such as downhill skiing, water skiing, surfing, rock climbing, cycling, gymnastics, and horseback riding.  Scuba diving.  Skydiving.  Yoga or Pilates in a room that is heated to high temperatures.  Jogging or running, unless you ran or jogged regularly before your pregnancy. While jogging or running, you should always be able to talk in full sentences. Do not run or jog so fast that you are unable to have a conversation.  Do not exercise at more than 6,000 feet above sea level (high elevation) if you are not used to exercising at high elevation. How do I exercise in a safe way?   Avoid overheating. Do not exercise in very high temperatures.  Wear loose-fitting, breathable clothes.  Avoid dehydration. Drink enough water before, during, and after exercise to keep your urine pale yellow.  Avoid overstretching. Because of hormone changes during pregnancy, it is easy to overstretch muscles, tendons, and ligaments during pregnancy.  Start slowly and ask your health care provider to recommend the types of exercise that are safe for you.  Do not exercise to lose weight. Follow these instructions at home:  Exercise on most days or all days of the week. Try to exercise for 30 minutes a day, 5 days a week, unless your health care provider tells you not to.  If   you actively exercised before your pregnancy and you are healthy, your health care provider may tell you to continue to do moderate to high-intensity exercise.  If you are just starting to exercise or did not exercise much before your pregnancy, your health care provider may tell you to do low to moderate-intensity  exercise. Questions to ask your health care provider  Is exercise safe for me?  What are signs that I should stop exercising?  Does my health condition mean that I should not exercise during pregnancy?  When should I avoid exercising during pregnancy? Stop exercising and contact a health care provider if: You have any unusual symptoms, such as:  Mild contractions of the uterus or cramps in the abdomen.  Dizziness that does not go away when you rest. Stop exercising and get help right away if: You have any unusual symptoms, such as:  Sudden, severe pain in your low back or your belly.  Mild contractions of the uterus or cramps in the abdomen that do not improve with rest and drinking fluids.  Chest pain.  Bleeding or fluid leaking from your vagina.  Shortness of breath. These symptoms may represent a serious problem that is an emergency. Do not wait to see if the symptoms will go away. Get medical help right away. Call your local emergency services (911 in the [place] Do not drive yourself to the hospital. Summary  Most women should exercise regularly throughout pregnancy. In rare cases, women with certain medical conditions or complications may be asked to limit or avoid exercise during pregnancy.  Do not exercise to lose weight during pregnancy.  Your health care provider will tell you what level of physical activity is right for you.  Stop exercising and contact a health care provider if you have mild contractions of the uterus or cramps in the abdomen. Get help right away if these contractions or cramps do not improve with rest and drinking fluids.  Stop exercising and get help right away if you have sudden, severe pain in your low back or belly, chest pain, shortness of breath, or bleeding or leaking of fluid from your vagina. This information is not intended to replace advice given to you by your health care provider. Make sure you discuss any questions you have with your  health care provider. Document Revised: 04/04/2019 Document Reviewed: 01/16/2019 Elsevier Patient Education  2020 [employer] Eating Plan for Pregnant Women While you are pregnant, your body requires additional nutrition to help support your growing baby. You also have a higher need for some vitamins and minerals, such as folic acid, calcium, iron, and vitamin D. Eating a healthy, well-balanced diet is very important for your health and your baby's health. Your need for extra calories varies for the three 3-month segments of your pregnancy (trimesters). For most women, it is recommended to consume:  150 extra calories a day during the first trimester.  300 extra calories a day during the second trimester.  300 extra calories a day during the third trimester. What are tips for following this plan?   Do not try to lose weight or go on a diet during pregnancy.  Limit your overall intake of foods that have "empty calories." These are foods that have little nutritional value, such as sweets, desserts, candies, and sugar-sweetened beverages.  Eat a variety of foods (especially fruits and vegetables) to get a full range of vitamins and minerals.  Take a prenatal vitamin to help meet your additional vitamin and mineral needs   during pregnancy, specifically for folic acid, iron, calcium, and vitamin D.  Remember to stay active. Ask your health care provider what types of exercise and activities are safe for you.  Practice good food safety and cleanliness. Wash your hands before you eat and after you prepare raw meat. Wash all fruits and vegetables well before peeling or eating. Taking these actions can help to prevent food-borne illnesses that can be very dangerous to your baby, such as listeriosis. Ask your health care provider for more information about listeriosis. What does 150 extra calories look like? Healthy options that provide 150 extra calories each day could be any of the  following:  6-8 oz (170-230 g) of plain low-fat yogurt with  cup of berries.  1 apple with 2 teaspoons (11 g) of peanut butter.  Cut-up vegetables with  cup (60 g) of hummus.  8 oz (230 mL) or 1 cup of low-fat chocolate milk.  1 stick of string cheese with 1 medium orange.  1 peanut butter and jelly sandwich that is made with one slice of whole-wheat bread and 1 tsp (5 g) of peanut butter. For 300 extra calories, you could eat two of those healthy options each day. What is a healthy amount of weight to gain? The right amount of weight gain for you is based on your BMI before you became pregnant. If your BMI:  Was less than 18 (underweight), you should gain 28-40 lb (13-18 kg).  Was 18-24.9 (normal), you should gain 25-35 lb (11-16 kg).  Was 25-29.9 (overweight), you should gain 15-25 lb (7-11 kg).  Was 30 or greater (obese), you should gain 11-20 lb (5-9 kg). What if I am having twins or multiples? Generally, if you are carrying twins or multiples:  You may need to eat 300-600 extra calories a day.  The recommended range for total weight gain is 25-54 lb (11-25 kg), depending on your BMI before pregnancy.  Talk with your health care provider to find out about nutritional needs, weight gain, and exercise that is right for you. What foods can I eat?  Fruits All fruits. Eat a variety of colors and types of fruit. Remember to wash your fruits well before peeling or eating. Vegetables All vegetables. Eat a variety of colors and types of vegetables. Remember to wash your vegetables well before peeling or eating. Grains All grains. Choose whole grains, such as whole-wheat bread, oatmeal, or brown rice. Meats and other protein foods Lean meats, including chicken, turkey, fish, and lean cuts of beef, veal, or pork. If you eat fish or seafood, choose options that are higher in omega-3 fatty acids and lower in mercury, such as salmon, herring, mussels, trout, sardines, pollock,  shrimp, crab, and lobster. Tofu. Tempeh. Beans. Eggs. Peanut butter and other nut butters. Make sure that all meats, poultry, and eggs are cooked to food-safe temperatures or "well-done." Two or more servings of fish are recommended each week in order to get the most benefits from omega-3 fatty acids that are found in seafood. Choose fish that are lower in mercury. You can find more information online:  www.fda.gov Dairy Pasteurized milk and milk alternatives (such as almond milk). Pasteurized yogurt and pasteurized cheese. Cottage cheese. Sour cream. Beverages Water. Juices that contain 100% fruit juice or vegetable juice. Caffeine-free teas and decaffeinated coffee. Drinks that contain caffeine are okay to drink, but it is better to avoid caffeine. Keep your total caffeine intake to less than 200 mg each day (which is 12 oz   or 355 mL of coffee, tea, or soda) or the limit as told by your health care provider. Fats and oils Fats and oils are okay to include in moderation. Sweets and desserts Sweets and desserts are okay to include in moderation. Seasoning and other foods All pasteurized condiments. The items listed above may not be a complete list of foods and beverages you can eat. Contact a dietitian for more information. What foods are not recommended? Fruits Unpasteurized fruit juices. Vegetables Raw (unpasteurized) vegetable juices. Meats and other protein foods Lunch meats, bologna, hot dogs, or other deli meats. (If you must eat those meats, reheat them until they are steaming hot.) Refrigerated pat, meat spreads from a meat counter, smoked seafood that is found in the refrigerated section of a store. Raw or undercooked meats, poultry, and eggs. Raw fish, such as sushi or sashimi. Fish that have high mercury content, such as tilefish, shark, swordfish, and king mackerel. To learn more about mercury in fish, talk with your health care provider or look for online resources, such  as:  www.fda.gov Dairy Raw (unpasteurized) milk and any foods that have raw milk in them. Soft cheeses, such as feta, queso blanco, queso fresco, Brie, Camembert cheeses, blue-veined cheeses, and Panela cheese (unless it is made with pasteurized milk, which must be stated on the label). Beverages Alcohol. Sugar-sweetened beverages, such as sodas, teas, or energy drinks. Seasoning and other foods Homemade fermented foods and drinks, such as pickles, sauerkraut, or kombucha drinks. (Store-bought pasteurized versions of these are okay.) Salads that are made in a store or deli, such as ham salad, chicken salad, egg salad, tuna salad, and seafood salad. The items listed above may not be a complete list of foods and beverages you should avoid. Contact a dietitian for more information. Where to find more information To calculate the number of calories you need based on your height, weight, and activity level, you can use an online calculator such as:  www.choosemyplate.gov/MyPlatePlan To calculate how much weight you should gain during pregnancy, you can use an online pregnancy weight gain calculator such as:  www.choosemyplate.gov/pregnancy-weight-gain-calculator Summary  While you are pregnant, your body requires additional nutrition to help support your growing baby.  Eat a variety of foods, especially fruits and vegetables to get a full range of vitamins and minerals.  Practice good food safety and cleanliness. Wash your hands before you eat and after you prepare raw meat. Wash all fruits and vegetables well before peeling or eating. Taking these actions can help to prevent food-borne illnesses, such as listeriosis, that can be very dangerous to your baby.  Do not eat raw meat or fish. Do not eat fish that have high mercury content, such as tilefish, shark, swordfish, and king mackerel. Do not eat unpasteurized (raw) dairy.  Take a prenatal vitamin to help meet your additional vitamin and  mineral needs during pregnancy, specifically for folic acid, iron, calcium, and vitamin D. This information is not intended to replace advice given to you by your health care provider. Make sure you discuss any questions you have with your health care provider. Document Revised: 05/02/2019 Document Reviewed: 09/08/2017 Elsevier Patient Education  2020 [employer] Prenatal Care Prenatal care is health care during pregnancy. It helps you and your unborn baby (fetus) stay as healthy as possible. Prenatal care may be provided by a midwife, a family practice health care provider, or a childbirth and pregnancy specialist (obstetrician). How does this affect me? During pregnancy, you will be closely monitored   for any new conditions that might develop. To lower your risk of pregnancy complications, you and your health care provider will talk about any underlying conditions you have. How does this affect my baby? Early and consistent prenatal care increases the chance that your baby will be healthy during pregnancy. Prenatal care lowers the risk that your baby will be:  Born early (prematurely).  Smaller than expected at birth (small for gestational age). What can I expect at the first prenatal care visit? Your first prenatal care visit will likely be the longest. You should schedule your first prenatal care visit as soon as you know that you are pregnant. Your first visit is a good time to talk about any questions or concerns you have about pregnancy. At your visit, you and your health care provider will talk about:  Your medical history, including: ? Any past pregnancies. ? Your family's medical history. ? The baby's father's medical history. ? Any long-term (chronic) health conditions you have and how you manage them. ? Any surgeries or procedures you have had. ? Any current over-the-counter or prescription medicines, herbs, or supplements you are taking.  Other factors that could pose a risk  to your baby, including:  Your home setting and your stress levels, including: ? Exposure to abuse or violence. ? Household financial strain. ? Mental health conditions you have.  Your daily health habits, including diet and exercise. Your health care provider will also:  Measure your weight, height, and blood pressure.  Do a physical exam, including a pelvic and breast exam.  Perform blood tests and urine tests to check for: ? Urinary tract infection. ? Sexually transmitted infections (STIs). ? Low iron levels in your blood (anemia). ? Blood type and certain proteins on red blood cells (Rh antibodies). ? Infections and immunity to viruses, such as hepatitis B and rubella. ? HIV (human immunodeficiency virus).  Do an ultrasound to confirm your baby's growth and development and to help predict your estimated due date (EDD). This ultrasound is done with a probe that is inserted into the vagina (transvaginal ultrasound).  Discuss your options for genetic screening.  Give you information about how to keep yourself and your baby healthy, including: ? Nutrition and taking vitamins. ? Physical activity. ? How to manage pregnancy symptoms such as nausea and vomiting (morning sickness). ? Infections and substances that may be harmful to your baby and how to avoid them. ? Food safety. ? Dental care. ? Working. ? Travel. ? Warning signs to watch for and when to call your health care provider. How often will I have prenatal care visits? After your first prenatal care visit, you will have regular visits throughout your pregnancy. The visit schedule is often as follows:  Up to week 28 of pregnancy: once every 4 weeks.  28-36 weeks: once every 2 weeks.  After 36 weeks: every week until delivery. Some women may have visits more or less often depending on any underlying health conditions and the health of the baby. Keep all follow-up and prenatal care visits as told by your health care  provider. This is important. What happens during routine prenatal care visits? Your health care provider will:  Measure your weight and blood pressure.  Check for fetal heart sounds.  Measure the height of your uterus in your abdomen (fundal height). This may be measured starting around week 20 of pregnancy.  Check the position of your baby inside your uterus.  Ask questions about your diet, sleeping patterns, and   whether you can feel the baby move.  Review warning signs to watch for and signs of labor.  Ask about any pregnancy symptoms you are having and how you are dealing with them. Symptoms may include: ? Headaches. ? Nausea and vomiting. ? Vaginal discharge. ? Swelling. ? Fatigue. ? Constipation. ? Any discomfort, including back or pelvic pain. Make a list of questions to ask your health care provider at your routine visits. What tests might I have during prenatal care visits? You may have blood, urine, and imaging tests throughout your pregnancy, such as:  Urine tests to check for glucose, protein, or signs of infection.  Glucose tests to check for a form of diabetes that can develop during pregnancy (gestational diabetes mellitus). This is usually done around week 24 of pregnancy.  An ultrasound to check your baby's growth and development and to check for birth defects. This is usually done around week 20 of pregnancy.  A test to check for group B strep (GBS) infection. This is usually done around week 36 of pregnancy.  Genetic testing. This may include blood or imaging tests, such as an ultrasound. Some genetic tests are done during the first trimester and some are done during the second trimester. What else can I expect during prenatal care visits? Your health care provider may recommend getting certain vaccines during pregnancy. These may include:  A yearly flu shot (annual influenza vaccine). This is especially important if you will be pregnant during flu  season.  Tdap (tetanus, diphtheria, pertussis) vaccine. Getting this vaccine during pregnancy can protect your baby from whooping cough (pertussis) after birth. This vaccine may be recommended between weeks 27 and 36 of pregnancy. Later in your pregnancy, your health care provider may give you information about:  Childbirth and breastfeeding classes.  Choosing a health care provider for your baby.  Umbilical cord banking.  Breastfeeding.  Birth control after your baby is born.  The hospital labor and delivery unit and how to tour it.  Registering at the hospital before you go into labor. Where to find more information  Office on Women's Health: womenshealth.gov  American Pregnancy Association: americanpregnancy.org  March of Dimes: marchofdimes.org Summary  Prenatal care helps you and your baby stay as healthy as possible during pregnancy.  Your first prenatal care visit will most likely be the longest.  You will have visits and tests throughout your pregnancy to monitor your health and your baby's health.  Bring a list of questions to your visits to ask your health care provider.  Make sure to keep all follow-up and prenatal care visits with your health care provider. This information is not intended to replace advice given to you by your health care provider. Make sure you discuss any questions you have with your health care provider. Document Revised: 04/03/2019 Document Reviewed: 12/11/2017 Elsevier Patient Education  2020 [employer]  

## 2020-06-18 NOTE — Progress Notes (Addendum)
New Obstetric Patient H&P    Chief Complaint: "Desires prenatal care"   History of Present Illness: Patient is a 24 y.o. [phone] Not Hispanic or Latino female, presents with amenorrhea and positive home pregnancy test. Patient's last menstrual period was 05/12/2020 (exact date). and based on her  LMP, her EDD is Estimated Date of Delivery: 02/16/21 and her EGA is [redacted]w[redacted]d. Cycles are 4 days, regular, and occur approximately every : 28 days. Her last pap smear was 4 or 5 years ago and was no abnormalities.    She had a urine pregnancy test which was positive 1 week(s)  ago. Her last menstrual period was normal and lasted for  4 day(s). Since her LMP she claims she has experienced breast tenderness, fatigue, nausea, vomiting. She denies vaginal bleeding. Her past medical history is noncontributory. Her prior pregnancies are notable for G1 2016 TAB, G2 2017 FT SVD female 6#12oz, G3 2018 TAB, G4 2019 TAB. She denies complications with any previous pregnancy.  Since her LMP, she admits to the use of tobacco products  no She claims she has gained   5 pounds since the start of her pregnancy.  There are cats in the home in the home  no  She admits close contact with children on a regular basis  yes  She has had chicken pox in the past no She has had Tuberculosis exposures, symptoms, or previously tested positive for TB   no Current or past history of domestic violence. no  Genetic Screening/Teratology Counseling: (Includes patient, baby's father, or anyone in either family with:)   1. Patient's age >/= 52 at [facility]  no 2. Thalassemia ([place], [place], Mediterranean, or Asian background): MCV<80  no 3. Neural tube defect (meningomyelocele, spina bifida, anencephaly)  no 4. Congenital heart defect  no  5. Down syndrome  no 6. Tay-Sachs (Jewish, [place]  no 7. Canavan's Disease  no 8. Sickle cell disease or trait (African)  no  9. Hemophilia or other blood disorders  no  10. Muscular dystrophy   no  11. Cystic fibrosis  no  12. Huntington's Chorea  no  13. Mental retardation/autism  no 14. Other inherited genetic or chromosomal disorder  no 15. Maternal metabolic disorder (DM, PKU, etc)  no 16. Patient or FOB with a child with a birth defect not listed above no  16a. Patient or FOB with a birth defect themselves no 17. Recurrent pregnancy loss, or stillbirth  no  18. Any medications since LMP other than prenatal vitamins (include vitamins, supplements, OTC meds, drugs, alcohol)  no 19. Any other genetic/environmental exposure to discuss  no  Infection History:   1. Lives with someone with TB or TB exposed  no  2. Patient or partner has history of genital herpes  no 3. Rash or viral illness since LMP  no 4. History of STI (GC, CT, HPV, syphilis, HIV)  no 5. History of recent travel :  no  Other pertinent information:  no     Review of Systems:10 point review of systems negative unless otherwise noted in HPI  Past Medical History:  Patient Active Problem List   Diagnosis Date Noted  . Supervision of high risk pregnancy in first trimester 06/18/2020    Clinic [facility] Prenatal Labs  Dating  Blood type:     Genetic Screen 1 Screen:    AFP:     Quad:     NIPS: Antibody:   Anatomic [place]  Rubella:    Varicella: @VZVIGG @  GTT Early:                Third trimester:  RPR:     Rhogam  HBsAg:     Vaccines TDAP:                       Flu Shot: HIV:     Baby Food Breast prefers to pump                            GBS:   GC/CT:   Contraception  Pap: 06/18/20  CBB     CS/VBAC NA   Support Person Partner Calcium       . Obesity affecting pregnancy in first trimester 06/18/2020  . Gall stones     Past Surgical History:  Past Surgical History:  Procedure Laterality Date  . ABDOMINAL SURGERY    . INDUCED ABORTION  02/2015  . INDUCED ABORTION     x 2  . INDUCED ABORTION     x 3    Gynecologic History: Patient's last menstrual period was 05/12/2020 (exact  date).  Obstetric History: [id number]  Family History:  History reviewed. No pertinent family history.  Social History:  Social History   Socioeconomic History  . Marital status: Single    Spouse name: Not on file  . Number of children: Not on file  . Years of education: Not on file  . Highest education level: Not on file  Occupational History  . Occupation: Unemployed  Tobacco Use  . Smoking status: Current Some Day Smoker    Packs/day: 1.00    Years: 1.50    Pack years: 1.50    Types: Cigarettes  . Smokeless tobacco: Never Used  Vaping Use  . Vaping Use: Never used  Substance and Sexual Activity  . Alcohol use: Not Currently  . Drug use: No  . Sexual activity: Yes    Birth control/protection: None  Other Topics Concern  . Not on file  Social History Narrative  . Not on file   Social Determinants of Health   Financial Resource Strain:   . Difficulty of Paying Living Expenses:   Food Insecurity:   . Worried About Programme researcher, broadcasting/film/video in the Last Year:   . Barista in the Last Year:   Transportation Needs:   . Freight forwarder (Medical):   Marland Kitchen Lack of Transportation (Non-Medical):   Physical Activity:   . Days of Exercise per Week:   . Minutes of Exercise per Session:   Stress:   . Feeling of Stress :   Social Connections:   . Frequency of Communication with Friends and Family:   . Frequency of Social Gatherings with Friends and Family:   . Attends Religious Services:   . Active Member of Clubs or Organizations:   . Attends Banker Meetings:   Marland Kitchen Marital Status:   Intimate Partner Violence:   . Fear of Current or Ex-Partner:   . Emotionally Abused:   Marland Kitchen Physically Abused:   . Sexually Abused:     Allergies:  No Known Allergies  Medications: Prior to Admission medications   Not on File    Physical Exam Vitals: Blood pressure 110/73, pulse 85, weight 257 lb (116.6 kg), last menstrual period 05/12/2020.  General: NAD HEENT:  normocephalic, anicteric Thyroid: no enlargement, no palpable nodules Pulmonary: No increased work of breathing, CTAB Cardiovascular: RRR, distal pulses 2+  Abdomen: NABS, soft, non-tender, non-distended.  Umbilicus without lesions.  No hepatomegaly, splenomegaly or masses palpable. No evidence of hernia  Genitourinary:  External: Normal external female genitalia.  Normal urethral meatus, normal  Bartholin's and Skene's glands.    Vagina: Normal vaginal mucosa, no evidence of prolapse.    Cervix: Grossly normal in appearance, friable with exam, no CMT  Uterus:  Non-enlarged, mobile, normal contour.    Adnexa: ovaries non-enlarged, no adnexal masses  Rectal: deferred Extremities: no edema, erythema, or tenderness Neurologic: Grossly intact Psychiatric: mood appropriate, affect full  The following were addressed during this visit:  Breastfeeding Education - Early initiation of breastfeeding    Comments: Keeps milk supply adequate, helps contract uterus and slow bleeding, and early milk is the perfect first food and is easy to digest.   - The importance of exclusive breastfeeding    Comments: Provides antibodies, Lower risk of breast and ovarian cancers, and type-2 diabetes,Helps your body recover, Reduced chance of SIDS.   - Risks of giving your baby anything other than breast milk if you are breastfeeding    Comments: Make the baby less content with breastfeeds, may make my baby more susceptible to illness, and may reduce my milk supply.   - The importance of early skin-to-skin contact    Comments: Keeps baby warm and secure, helps keep baby's blood sugar up and breathing steady, easier to bond and breastfeed, and helps calm baby.  - Rooming-in on a 24-hour basis    Comments: Easier to learn baby's feeding cues, easier to bond and get to know each other, and encourages milk production.   - Feeding on demand or baby-led feeding    Comments: Helps prevent breastfeeding  complications, helps bring in good milk supply, prevents under or overfeeding, and helps baby feel content and satisfied   - Frequent feeding to help assure optimal milk production    Comments: Making a full supply of milk requires frequent removal of milk from breasts, infant will eat 8-12 times in 24 hours, if separated from infant use breast massage, hand expression and/ or pumping to remove milk from breasts.   - Effective positioning and attachment    Comments: Helps my baby to get enough breast milk, helps to produce an adequate milk supply, and helps prevent nipple pain and damage   - Exclusive breastfeeding for the first 6 months    Comments: Builds a healthy milk supply and keeps it up, protects baby from sickness and disease, and breastmilk has everything your baby needs for the first 6 months.    Assessment: 24 y.o. [id number] at [redacted]w[redacted]d by LMP presenting to initiate prenatal care  Plan: 1) Avoid alcoholic beverages. 2) Patient encouraged not to smoke.  3) Discontinue the use of all non-medicinal drugs and chemicals.  4) Take prenatal vitamins daily.  5) Nutrition, food safety (fish, cheese advisories, and high nitrite foods) and exercise discussed. 6) Hospital and practice style discussed with cross coverage system.  7) Genetic Screening, such as with 1st Trimester Screening, cell free fetal DNA, AFP testing, and Ultrasound, as well as with amniocentesis and CVS as appropriate, is discussed with patient. At the conclusion of today's visit patient requested cell free DNA genetic testing 8) Patient is asked about travel to areas at risk for the Zika virus, and counseled to avoid travel and exposure to mosquitoes or sexual partners who may have themselves been exposed to the virus. Testing is discussed, and will be ordered as appropriate.  9) Urine  culture, PAPtima today 10) Return to clinic in 2 weeks for dating scan, early 1 hr gtt, future ordered NOB panel and rob 11) MaterniT 21  at 10+ weeks   Tresea Mall, CNM [facility] 06/18/2020, 10:29 AM

## 2020-06-20 LAB — URINE CULTURE: Organism ID, Bacteria: NO GROWTH

## 2020-06-23 LAB — CYTOLOGY - PAP
Chlamydia: NEGATIVE
Comment: NEGATIVE
Comment: NEGATIVE
Comment: NORMAL
Diagnosis: NEGATIVE
Neisseria Gonorrhea: NEGATIVE
Trichomonas: NEGATIVE

## 2020-07-01 ENCOUNTER — Other Ambulatory Visit: Payer: Self-pay

## 2020-07-01 ENCOUNTER — Ambulatory Visit (INDEPENDENT_AMBULATORY_CARE_PROVIDER_SITE_OTHER): Payer: Medicaid Other

## 2020-07-01 ENCOUNTER — Other Ambulatory Visit: Payer: Self-pay | Admitting: Advanced Practice Midwife

## 2020-07-01 ENCOUNTER — Ambulatory Visit (INDEPENDENT_AMBULATORY_CARE_PROVIDER_SITE_OTHER): Payer: Medicaid Other | Admitting: Obstetrics & Gynecology

## 2020-07-01 ENCOUNTER — Other Ambulatory Visit: Payer: Medicaid Other

## 2020-07-01 ENCOUNTER — Encounter: Payer: Self-pay | Admitting: Obstetrics & Gynecology

## 2020-07-01 VITALS — BP 120/80 | Wt 260.0 lb

## 2020-07-01 DIAGNOSIS — O0991 Supervision of high risk pregnancy, unspecified, first trimester: Secondary | ICD-10-CM | POA: Diagnosis not present

## 2020-07-01 DIAGNOSIS — Z3A01 Less than 8 weeks gestation of pregnancy: Secondary | ICD-10-CM

## 2020-07-01 DIAGNOSIS — Z113 Encounter for screening for infections with a predominantly sexual mode of transmission: Secondary | ICD-10-CM

## 2020-07-01 DIAGNOSIS — O99211 Obesity complicating pregnancy, first trimester: Secondary | ICD-10-CM

## 2020-07-01 NOTE — Progress Notes (Signed)
  Subjective  Nausea? yes B6 helps No VB Objective  BP 120/80   Wt 260 lb (117.9 kg)   LMP 05/12/2020 (Exact Date)   BMI 46.06 kg/m  General: NAD Pumonary: no increased work of breathing Abdomen: gravid, non-tender Extremities: no edema Psychiatric: mood appropriate, affect full  Review of ULTRASOUND.    I have personally reviewed images and report of recent ultrasound done at [facility].    Plan of management to be discussed with patient.  Assessment  24 y.o. [id number] at [redacted]w[redacted]d by  02/16/2021, by Last Menstrual Period presenting for routine prenatal visit  Plan   Problem List Items Addressed This Visit    None    Visit Diagnoses    [redacted] weeks gestation of pregnancy    -  Primary    PNV NIPT nv Labs today, glucola [place] discussed  Annamarie Major, MD, [facility], [facility] 07/01/2020  5:14 PM

## 2020-07-01 NOTE — Patient Instructions (Signed)
Genetic Testing During Pregnancy Genetic testing during pregnancy is also called prenatal genetic testing. This type of testing can determine if your baby is at risk of being born with a disorder caused by abnormal genes or chromosomes (genetic disorder). Chromosomes contain genes that control how your baby will develop in your womb. There are many different genetic disorders. Examples of genetic disorders that may be found through genetic testing include Down syndrome and cystic fibrosis. Gene changes (mutations) can be passed down through families. Genetic testing is offered to all women before or during pregnancy. You can choose whether to have genetic testing. Why is genetic testing done? Genetic testing is done during pregnancy to find out whether your child is at risk for a genetic disorder. Having genetic testing allows you to:  Discuss your test results and options with a genetic counselor.  Prepare for a baby that may be born with a genetic disorder. Learning about the disorder ahead of time helps you be better prepared to manage it. Your health care providers can also be prepared in case your baby requires special care before or after birth.  Consider whether you want to continue with the pregnancy. In some cases, genetic testing may be done to learn about the traits a child will inherit. Types of genetic tests There are two basic types of genetic testing. Screening tests indicate whether your developing baby (fetus) is at higher risk for a genetic disorder. Diagnostic tests check actual fetal cells to diagnose a genetic disorder. Screening tests     Screening tests will not harm your baby. They are recommended for all pregnant women. Types of screening tests include:  Carrier screening. This test involves checking genes from both parents by testing their blood or saliva. The test checks to find out if the parents carry a genetic mutation that may be passed to a baby. In most cases,  both parents must carry the mutation for a baby to be at risk.  First trimester screening. This test combines a blood test with sound wave imaging of your baby (fetal ultrasound). This screening test checks for a risk of Down syndrome or other defects caused by having extra chromosomes. It also checks for defects of the heart, abdomen, or skeleton.  Second trimester screening also combines a blood test with a fetal ultrasound exam. It checks for a risk of genetic defects of the face, brain, spine, heart, or limbs.  Combined or sequential screening. This type of testing combines the results of first and second trimester screening. This type of testing may be more accurate than first or second trimester screening alone.  Cell-free DNA testing. This is a blood test that detects cells released by the placenta that get into the mother's blood. It can be used to check for a risk of Down syndrome, other extra chromosome syndromes, and disorders caused by abnormal numbers of sex chromosomes. This test can be done any time after 10 weeks of pregnancy.  Diagnostic tests Diagnostic tests carry slight risks of problems, including bleeding, infection, and loss of the pregnancy. These tests are done only if your baby is at risk for a genetic disorder. You may meet with a genetic counselor to discuss the risks and benefits before having diagnostic tests. Examples of diagnostic tests include:  Chorionic villus sampling (CVS). This involves a procedure to remove and test a sample of cells taken from the placenta. The procedure may be done between 10 and 12 weeks of pregnancy.  Amniocentesis. This involves a   procedure to remove and test a sample of fluid (amniotic fluid) and cells from the sac that surrounds the developing baby. The procedure may be done between 15 and 20 weeks of pregnancy. What do the results mean? For a screening test:  If the results are negative, it often means that your child is not at higher  risk. There is still a slight chance your child could have a genetic disorder.  If the results are positive, it does not mean your child will have a genetic disorder. It may mean that your child has a higher-than-normal risk for a genetic disorder. In that case, you may want to talk with a genetic counselor about whether you should have diagnostic genetic tests. For a diagnostic test:  If the result is negative, it is unlikely that your child will have a genetic disorder.  If the test is positive for a genetic disorder, it is likely that your child will have the disorder. The test may not tell how severe the disorder will be. Talk with your health care provider about your options. Questions to ask your health care provider Before talking to your health care provider about genetic testing, find out if there is a history of genetic disorders in your family. It may also help to know your family's ethnic origins. Then ask your health care provider the following questions:  Is my baby at risk for a genetic disorder?  What are the benefits of having genetic screening?  What tests are best for me and my baby?  What are the risks of each test?  If I get a positive result on a screening test, what is the next step?  Should I meet with a genetic counselor before having a diagnostic test?  Should my partner or other members of my family be tested?  How much do the tests cost? Will my insurance cover the testing? Summary  Genetic testing is done during pregnancy to find out whether your child is at risk for a genetic disorder.  Genetic testing is offered to all women before or during pregnancy. You can choose whether to have genetic testing.  There are two basic types of genetic testing. Screening tests indicate whether your developing baby (fetus) is at higher risk for a genetic disorder. Diagnostic tests check actual fetal cells to diagnose a genetic disorder.  If a diagnostic genetic test is  positive, talk with your health care provider about your options. This information is not intended to replace advice given to you by your health care provider. Make sure you discuss any questions you have with your health care provider. Document Revised: 04/04/2019 Document Reviewed: 02/26/2018 Elsevier Patient Education  2020 Elsevier Inc.  

## 2020-07-02 LAB — RPR+RH+ABO+RUB AB+AB SCR+CB...
Antibody Screen: NEGATIVE
HIV Screen 4th Generation wRfx: NONREACTIVE
Hematocrit: 35.1 % (ref 34.0–46.6)
Hemoglobin: 12.3 g/dL (ref 11.1–15.9)
Hepatitis B Surface Ag: NEGATIVE
MCH: 30.8 pg (ref 26.6–33.0)
MCHC: 35 g/dL (ref 31.5–35.7)
MCV: 88 fL (ref 79–97)
Platelets: 271 10*3/uL (ref 150–450)
RBC: 4 x10E6/uL (ref 3.77–5.28)
RDW: 12.4 % (ref 11.7–15.4)
RPR Ser Ql: NONREACTIVE
Rh Factor: POSITIVE
Rubella Antibodies, IGG: 1.25 index (ref >0.99)
Varicella zoster IgG: <135 index — ABNORMAL LOW (ref >165)
WBC: 8.2 10*3/uL (ref 3.4–10.8)

## 2020-07-02 LAB — GLUCOSE, 1 HOUR GESTATIONAL: Gestational Diabetes Screen: 42 mg/dL — ABNORMAL LOW (ref 65–139)

## 2020-07-22 ENCOUNTER — Ambulatory Visit (INDEPENDENT_AMBULATORY_CARE_PROVIDER_SITE_OTHER): Payer: Medicaid Other | Admitting: Certified Nurse Midwife

## 2020-07-22 ENCOUNTER — Other Ambulatory Visit: Payer: Self-pay

## 2020-07-22 VITALS — BP 100/58 | Wt 261.0 lb

## 2020-07-22 DIAGNOSIS — Z6841 Body Mass Index (BMI) 40.0 and over, adult: Secondary | ICD-10-CM | POA: Insufficient documentation

## 2020-07-22 DIAGNOSIS — Z3A10 10 weeks gestation of pregnancy: Secondary | ICD-10-CM

## 2020-07-22 DIAGNOSIS — O0991 Supervision of high risk pregnancy, unspecified, first trimester: Secondary | ICD-10-CM

## 2020-07-22 DIAGNOSIS — Z1379 Encounter for other screening for genetic and chromosomal anomalies: Secondary | ICD-10-CM

## 2020-07-22 LAB — POCT URINALYSIS DIPSTICK OB: Glucose, UA: NEGATIVE

## 2020-07-22 MED ORDER — DOXYLAMINE-PYRIDOXINE 10-10 MG PO TBEC: 2.0000 | DELAYED_RELEASE_TABLET | Freq: Every day | ORAL | 5 refills | Status: DC

## 2020-07-22 NOTE — Progress Notes (Signed)
C/O cramps for over a month now; pressure headaches 2hr before bed - e.s tylenol helps but doesn't take the pressure away.rj

## 2020-07-22 NOTE — Progress Notes (Signed)
ROB at 10wk1d: Doing well. No vaginal bleeding. Some cramping. Nausea persists, particularly in the AM.  Desires NIPT testing today.  Reviewed NOB labs. Pap NIL. 1 hour GTT 42 FHT 155 with DT. FH at SP+4-5 FB. Weight up 4# since NOB. BMI 46.23 kg/m2. BP 100/58  A: IUP at 10wk1d Obesity in pregnancy with BMI>40 Nausea of pregnancy  P: Start ASA 81 mgm after 12 weeks MaterniT 21 today. RTO in 4 weeks Trial of Diclegis-2 capsules at hs, may take up to 4/day  Farrel Conners, CNM

## 2020-07-28 LAB — MATERNIT 21 PLUS CORE, BLOOD
Fetal Fraction: 8
Result (T21): NEGATIVE
Trisomy 13 (Patau syndrome): NEGATIVE
Trisomy 18 (Edwards syndrome): NEGATIVE
Trisomy 21 (Down syndrome): NEGATIVE

## 2020-08-19 ENCOUNTER — Ambulatory Visit (INDEPENDENT_AMBULATORY_CARE_PROVIDER_SITE_OTHER): Payer: Medicaid Other | Admitting: Obstetrics

## 2020-08-19 ENCOUNTER — Other Ambulatory Visit: Payer: Self-pay

## 2020-08-19 VITALS — BP 128/72 | Wt 262.4 lb

## 2020-08-19 DIAGNOSIS — O0990 Supervision of high risk pregnancy, unspecified, unspecified trimester: Secondary | ICD-10-CM

## 2020-08-19 DIAGNOSIS — O0992 Supervision of high risk pregnancy, unspecified, second trimester: Secondary | ICD-10-CM

## 2020-08-19 DIAGNOSIS — Z3A14 14 weeks gestation of pregnancy: Secondary | ICD-10-CM

## 2020-08-19 LAB — POCT URINALYSIS DIPSTICK OB
Glucose, UA: NEGATIVE
POC,PROTEIN,UA: NEGATIVE

## 2020-08-19 NOTE — Addendum Note (Signed)
Addended by: Clement Husbands A on: 08/19/2020 04:18 PM   Modules accepted: Orders

## 2020-08-19 NOTE — Progress Notes (Signed)
  Routine Prenatal Care Visit  Subjective  Anne Rogers is a 24 y.o. G1W2993 at [redacted]w[redacted]d being seen today for ongoing prenatal care.  She is currently monitored for the following issues for this high-risk pregnancy and has Gall stones; Supervision of high risk pregnancy in first trimester; Obesity affecting pregnancy in first trimester; and Adult BMI 45.0-49.9 kg/sq m (HCC) on their problem list.  ----------------------------------------------------------------------------------- Patient reports no complaints.  She stopped taking diclegis as it made her too sleepy to take care of her other child.  .  .  Movement: Present. Leaking Fluid denies.  ----------------------------------------------------------------------------------- The following portions of the patient's history were reviewed and updated as appropriate: allergies, current medications, past family history, past medical history, past social history, past surgical history and problem list. Problem list updated.  Objective  Blood pressure 128/72, weight 262 lb 6.4 oz (119 kg), last menstrual period 05/12/2020. Pregravid weight 252 lb (114.3 kg) Total Weight Gain 10 lb 6.4 oz (4.717 kg) Urinalysis: Urine Protein    Urine Glucose    Fetal Status:     Movement: Present     General:  Alert, oriented and cooperative. Patient is in no acute distress.  Skin: Skin is warm and dry. No rash noted.   Cardiovascular: Normal heart rate noted  Respiratory: Normal respiratory effort, no problems with respiration noted  Abdomen: Soft, gravid, appropriate for gestational age.       Pelvic:  Cervical exam deferred        Extremities: Normal range of motion.     Mental Status: Normal mood and affect. Normal behavior. Normal judgment and thought content.   Assessment   23 y.o. [id number] at [redacted]w[redacted]d by  02/16/2021, by Last Menstrual Period presenting for routine prenatal visit  Plan   Pregnancy#5 Problems (from 05/12/20 to present)    No problems  associated with this episode.       Preterm labor symptoms and general obstetric precautions including but not limited to vaginal bleeding, contractions, leaking of fluid and fetal movement were reviewed in detail with the patient. Please refer to After Visit Summary for other counseling recommendations.   Return in about 4 weeks (around 09/16/2020) for return OB and  anatomy scan.  [name], CNM  08/19/2020 3:43 PM

## 2020-09-16 ENCOUNTER — Ambulatory Visit (INDEPENDENT_AMBULATORY_CARE_PROVIDER_SITE_OTHER): Payer: Medicaid Other

## 2020-09-16 ENCOUNTER — Other Ambulatory Visit: Payer: Self-pay

## 2020-09-16 ENCOUNTER — Encounter: Payer: Medicaid Other | Admitting: Obstetrics and Gynecology

## 2020-09-16 DIAGNOSIS — O0990 Supervision of high risk pregnancy, unspecified, unspecified trimester: Secondary | ICD-10-CM

## 2020-09-18 ENCOUNTER — Other Ambulatory Visit: Payer: Self-pay

## 2020-09-18 ENCOUNTER — Encounter: Payer: Self-pay | Admitting: Obstetrics & Gynecology

## 2020-09-18 ENCOUNTER — Ambulatory Visit (INDEPENDENT_AMBULATORY_CARE_PROVIDER_SITE_OTHER): Payer: Medicaid Other | Admitting: Obstetrics & Gynecology

## 2020-09-18 VITALS — BP 128/80 | Wt 270.0 lb

## 2020-09-18 DIAGNOSIS — O0992 Supervision of high risk pregnancy, unspecified, second trimester: Secondary | ICD-10-CM

## 2020-09-18 DIAGNOSIS — Z6841 Body Mass Index (BMI) 40.0 and over, adult: Secondary | ICD-10-CM

## 2020-09-18 DIAGNOSIS — Z3A18 18 weeks gestation of pregnancy: Secondary | ICD-10-CM | POA: Diagnosis not present

## 2020-09-18 DIAGNOSIS — O99212 Obesity complicating pregnancy, second trimester: Secondary | ICD-10-CM

## 2020-09-18 LAB — POCT URINALYSIS DIPSTICK OB
Glucose, UA: NEGATIVE
POC,PROTEIN,UA: NEGATIVE

## 2020-09-18 MED ORDER — ESOMEPRAZOLE MAGNESIUM 20 MG PO CPDR: 20.0000 mg | DELAYED_RELEASE_CAPSULE | Freq: Every day | ORAL | 11 refills | Status: DC

## 2020-09-18 NOTE — Addendum Note (Signed)
Addended by: Cornelius Moras D on: 09/18/2020 02:48 PM   Modules accepted: Orders

## 2020-09-18 NOTE — Progress Notes (Signed)
  Subjective  Fetal Movement? yes Contractions? no Leaking Fluid? no Vaginal Bleeding? no Heartburn and thus nausea severe  Objective  BP 128/80   Wt 270 lb (122.5 kg)   LMP 05/12/2020 (Exact Date)   BMI 47.83 kg/m  General: NAD Pumonary: no increased work of breathing Abdomen: gravid, non-tender Extremities: no edema Psychiatric: mood appropriate, affect full  Assessment  23 y.o. [id number] at [redacted]w[redacted]d by  02/16/2021, by Last Menstrual Period presenting for routine prenatal visit  Plan   Problem List Items Addressed This Visit      Other   Obesity affecting pregnancy in first trimester   Adult BMI 45.0-49.9 kg/sq m (HCC)    Other Visit Diagnoses    [redacted] weeks gestation of pregnancy    -  Primary   Supervision of high risk pregnancy in second trimester        Nexium Rx for GERD PNV [place] discussed. Review of ULTRASOUND. I have personally reviewed images and report of recent ultrasound done at [facility]. There is a singleton gestation with subjectively normal amniotic fluid volume. The fetal biometry correlates with established dating. Detailed evaluation of the fetal anatomy was performed.The fetal anatomical survey appears within normal limits within the resolution of ultrasound as described above.  It must be noted that a normal ultrasound is unable to rule out fetal aneuploidy.   Follow up for views not well seen nv  Annamarie Major, MD, [facility], [facility] 09/18/2020  2:45 PM

## 2020-09-18 NOTE — Patient Instructions (Signed)

## 2020-10-06 ENCOUNTER — Other Ambulatory Visit: Payer: Self-pay | Admitting: Obstetrics

## 2020-10-12 ENCOUNTER — Other Ambulatory Visit: Payer: Self-pay | Admitting: Obstetrics & Gynecology

## 2020-10-12 DIAGNOSIS — Z3689 Encounter for other specified antenatal screening: Secondary | ICD-10-CM

## 2020-10-16 ENCOUNTER — Ambulatory Visit (INDEPENDENT_AMBULATORY_CARE_PROVIDER_SITE_OTHER): Payer: Medicaid Other | Admitting: Obstetrics & Gynecology

## 2020-10-16 ENCOUNTER — Ambulatory Visit (INDEPENDENT_AMBULATORY_CARE_PROVIDER_SITE_OTHER): Payer: Medicaid Other

## 2020-10-16 ENCOUNTER — Other Ambulatory Visit: Payer: Self-pay

## 2020-10-16 ENCOUNTER — Encounter: Payer: Self-pay | Admitting: Obstetrics & Gynecology

## 2020-10-16 VITALS — BP 120/62 | Wt 265.0 lb

## 2020-10-16 DIAGNOSIS — Z131 Encounter for screening for diabetes mellitus: Secondary | ICD-10-CM

## 2020-10-16 DIAGNOSIS — Z3689 Encounter for other specified antenatal screening: Secondary | ICD-10-CM

## 2020-10-16 DIAGNOSIS — Z3A22 22 weeks gestation of pregnancy: Secondary | ICD-10-CM

## 2020-10-16 DIAGNOSIS — O99212 Obesity complicating pregnancy, second trimester: Secondary | ICD-10-CM

## 2020-10-16 DIAGNOSIS — O0992 Supervision of high risk pregnancy, unspecified, second trimester: Secondary | ICD-10-CM

## 2020-10-16 LAB — POCT URINALYSIS DIPSTICK OB
Glucose, UA: NEGATIVE
POC,PROTEIN,UA: NEGATIVE

## 2020-10-16 NOTE — Patient Instructions (Signed)

## 2020-10-16 NOTE — Progress Notes (Signed)
  Subjective  Fetal Movement? yes Contractions? no Leaking Fluid? no Vaginal Bleeding? no Weigh gain stable Objective  BP 120/62   Wt 265 lb (120.2 kg)   LMP 05/12/2020 (Exact Date)   BMI 46.94 kg/m  General: NAD Pumonary: no increased work of breathing Abdomen: gravid, non-tender Extremities: no edema Psychiatric: mood appropriate, affect full  Assessment  23 y.o. [id number] at [redacted]w[redacted]d by  02/16/2021, by Last Menstrual Period presenting for routine prenatal visit  Plan   Problem List Items Addressed This Visit   Visit Diagnoses    [redacted] weeks gestation of pregnancy        -  PNV   Relevant Orders   POC Urinalysis Dipstick OB (Completed)   Screening for diabetes mellitus       Relevant Orders   28 Week RH+Panel nv   Supervision of high risk pregnancy in second trimester       Obesity affecting pregnancy in second trimester       Relevant Orders   Ambulatory referral to Anesthesiology    BMI >=40 [ ]  early 1h gtt - 42 [ ]  screen sleep apnea [ ]  u/s for dating [x ]  [ ]  nutritional goals [ ]  folic acid 1mg  [ ]  bASA (>12 weeks) [ ]  consider nutrition consult [ ]  consider maternal EKG 1st trimester [ ]  Growth u/s 28 [ ] , 32 [ ] , 36 weeks [ ]  [ ]  NST/AFI weekly 34+ weeks (34[] ,35[] ,36[] , 37[] , 38[] , 39[] , 40[] ) [ ]  IOL by 41 weeks (scheduled, prn [] )  08-08-1991, MD, Ob/Gyn, [facility] 10/16/2020  1:57 PM

## 2020-10-27 ENCOUNTER — Observation Stay
Admission: EM | Admit: 2020-10-27 | Discharge: 2020-10-27 | Disposition: A | Discharge status: Home / Self Care | Payer: Medicaid Other | Attending: Obstetrics and Gynecology | Admitting: Obstetrics and Gynecology

## 2020-10-27 ENCOUNTER — Encounter: Payer: Self-pay | Admitting: Obstetrics and Gynecology

## 2020-10-27 ENCOUNTER — Other Ambulatory Visit: Payer: Self-pay

## 2020-10-27 DIAGNOSIS — N898 Other specified noninflammatory disorders of vagina: Secondary | ICD-10-CM | POA: Diagnosis not present

## 2020-10-27 DIAGNOSIS — F17210 Nicotine dependence, cigarettes, uncomplicated: Secondary | ICD-10-CM | POA: Insufficient documentation

## 2020-10-27 DIAGNOSIS — Z3A24 24 weeks gestation of pregnancy: Secondary | ICD-10-CM | POA: Diagnosis not present

## 2020-10-27 DIAGNOSIS — O26892 Other specified pregnancy related conditions, second trimester: Principal | ICD-10-CM | POA: Insufficient documentation

## 2020-10-27 DIAGNOSIS — O99332 Smoking (tobacco) complicating pregnancy, second trimester: Secondary | ICD-10-CM | POA: Insufficient documentation

## 2020-10-27 HISTORY — DX: Depression, unspecified: F32.A

## 2020-10-27 HISTORY — DX: Anxiety disorder, unspecified: F41.9

## 2020-10-27 LAB — URINALYSIS, ROUTINE W REFLEX MICROSCOPIC
Bilirubin Urine: NEGATIVE
Glucose, UA: NEGATIVE mg/dL
Hgb urine dipstick: NEGATIVE
Ketones, ur: NEGATIVE mg/dL
Leukocytes,Ua: NEGATIVE
Nitrite: NEGATIVE
Protein, ur: NEGATIVE mg/dL
Specific Gravity, Urine: 1.018 (ref 1.005–1.030)
pH: 7 (ref 5.0–8.0)

## 2020-10-27 LAB — RUPTURE OF MEMBRANE (ROM)PLUS: Rom Plus: NEGATIVE

## 2020-10-27 NOTE — Discharge Summary (Signed)
Please see Final Progress note. [name], CNM  10/27/2020 1:08 PM

## 2020-10-27 NOTE — OB Triage Note (Signed)
24 yo female, [id number], [redacted]w[redacted]d presents to L&D with complaints of leaking fluid. Pt. reports positive fetal movement and denies feeling contractions. Pt. states that she noticed fluid leaking this morning after going to the bathroom. Pt. States that she noticed approx. 4 oz. Fluid expelled from vagina. Denies odor. Describes fluid as clear and states that it does not smell like urine. Will make provider aware and discuss plan of care.

## 2020-10-27 NOTE — Final Progress Note (Signed)
Final Progress Note  Patient ID: Anne Rogers MRN: [id number] DOB/AGE: Apr 11, 1996 24 y.o.  Admit date: 10/27/2020 Admitting provider: [name], CNM Discharge date: 10/27/2020   Admission Diagnoses: [redacted] weeks gestation of pregnancy Vaginal discharge Discharge Diagnoses:  Active Problems:   [redacted] weeks gestation of pregnancy   Vaginal discharge during pregnancy in second trimester    History of Present Illness: The patient is a 24 y.o. female [phone] at [redacted]w[redacted]d who presents for evalaution of some vaginal leaking of fluid since yesterday. She noticed additonal flow after using the bathroom. Her baby is moving well. She denies any recent IC. No active LOF upon her arrival. She denies any contractions. She denies any vaginal itching or burning; also denies any dysuria.  Past Medical History:  Diagnosis Date  . Anxiety   . Depression   . Gall stones     Past Surgical History:  Procedure Laterality Date  . ABDOMINAL SURGERY    . INDUCED ABORTION  02/2015  . INDUCED ABORTION     x 2  . INDUCED ABORTION     x 3    No current facility-administered medications on file prior to encounter.   Current Outpatient Medications on File Prior to Encounter  Medication Sig Dispense Refill  . esomeprazole (NEXIUM) 20 MG capsule Take 1 capsule (20 mg total) by mouth daily at 12 noon. 30 capsule 11  . Prenatal Vit-Fe Fumarate-FA (MULTIVITAMIN-PRENATAL) 27-0.8 MG TABS tablet Take 1 tablet by mouth daily at 12 noon.      No Known Allergies  Social History   Socioeconomic History  . Marital status: Single    Spouse name: Not on file  . Number of children: 1  . Years of education: Not on file  . Highest education level: Not on file  Occupational History  . Occupation: Unemployed  Tobacco Use  . Smoking status: Current Some Day Smoker    Packs/day: 0.25    Years: 1.50    Pack years: 0.37    Types: Cigarettes  . Smokeless tobacco: Never Used  Vaping Use  . Vaping Use: Every day   Substance and Sexual Activity  . Alcohol use: Not Currently  . Drug use: No  . Sexual activity: Yes    Birth control/protection: None  Other Topics Concern  . Not on file  Social History Narrative  . Not on file   Social Determinants of Health   Financial Resource Strain:   . Difficulty of Paying Living Expenses: Not on file  Food Insecurity:   . Worried About Programme researcher, broadcasting/film/video in the Last Year: Not on file  . Ran Out of Food in the Last Year: Not on file  Transportation Needs:   . Lack of Transportation (Medical): Not on file  . Lack of Transportation (Non-Medical): Not on file  Physical Activity:   . Days of Exercise per Week: Not on file  . Minutes of Exercise per Session: Not on file  Stress:   . Feeling of Stress : Not on file  Social Connections:   . Frequency of Communication with Friends and Family: Not on file  . Frequency of Social Gatherings with Friends and Family: Not on file  . Attends Religious Services: Not on file  . Active Member of Clubs or Organizations: Not on file  . Attends Banker Meetings: Not on file  . Marital Status: Not on file  Intimate Partner Violence:   . Fear of Current or Ex-Partner: Not on file  .  Emotionally Abused: Not on file  . Physically Abused: Not on file  . Sexually Abused: Not on file    History reviewed. No pertinent family history.   Review of Systems  All other systems reviewed and are negative.    Physical Exam: BP 116/65 (BP Location: Right Arm)   Pulse 96   Temp 98.5 F (36.9 C) (Oral)   Resp 16   Ht 5\' 4"  (1.626 m)   Wt 120.2 kg   LMP 05/12/2020 (Exact Date)   BMI 45.49 kg/m   Physical Exam Constitutional:      Appearance: Normal appearance. She is obese.  Genitourinary:     Vulva normal.  HENT:     Head: Normocephalic.     Mouth/Throat:     Mouth: Mucous membranes are moist.     Pharynx: Oropharynx is clear.  Eyes:     Extraocular Movements: Extraocular movements intact.      Pupils: Pupils are equal, round, and reactive to light.  Cardiovascular:     Rate and Rhythm: Normal rate and regular rhythm.     Pulses: Normal pulses.     Heart sounds: Normal heart sounds.  Pulmonary:     Effort: Pulmonary effort is normal.     Breath sounds: Normal breath sounds.  Abdominal:     General: Bowel sounds are normal.     Palpations: Abdomen is soft.     Comments: Gravid, adipose  Musculoskeletal:        General: Normal range of motion.     Cervical back: Normal range of motion and neck supple.  Neurological:     General: No focal deficit present.     Mental Status: She is alert and oriented to person, place, and time.  Skin:    General: Skin is warm and dry.  Psychiatric:        Mood and Affect: Mood normal.        Behavior: Behavior normal.     Consults: None  Significant Findings/ Diagnostic Studies: Rom Plus is 05/14/2020- shows no trich, few clue cells, no yeast buds or hyphae Negative ferning slide  Procedures: FHT check NST Baseline FHR: 140 beats/min Variability:fetus too premature to determine reactivity. FHTS reassuring for this gestational age  Tocometry: no contractions noted or palpated.  Interpretation:  INDICATIONS: vaginal discharge RESULTS:  A NST procedure was performed with FHR monitoring and a normal baseline established, as the fetus is too prmature to determine reactivity , FHTs are assessed for reassurance and are WNL baseline Hospital Course: The patient was admitted to Labor and Delivery Triage for observation. Fetal heart tones were assessed. Using a sterile spedulum, a visual insetpion fo the vaginal was perfomed and a Rom Plus test retrieved. Other labs; UA, Fern test, and wet prep all indicated a negative finding for PPROM.She is discharged home with instructions, with follow up planned tomorrow at [facility].  Discharge Condition: good  Disposition: Discharge disposition: 01-Home or Self Care       Diet:  Regular diet  Discharge Activity: Activity as tolerated  Discharge Instructions    Discharge activity:  No Restrictions   Complete by: As directed    Discharge diet:  No restrictions   Complete by: As directed    No sexual activity restrictions   Complete by: As directed    Notify physician for a general feeling that "something is not right"   Complete by: As directed    Notify physician for increase or change  in vaginal discharge   Complete by: As directed    Notify physician for intestinal cramps, with or without diarrhea, sometimes described as "gas pain"   Complete by: As directed    Notify physician for leaking of fluid   Complete by: As directed    Notify physician for low, dull backache, unrelieved by heat or Tylenol   Complete by: As directed    Notify physician for menstrual like cramps   Complete by: As directed    Notify physician for pelvic pressure   Complete by: As directed    Notify physician for uterine contractions.  These may be painless and feel like the uterus is tightening or the baby is  "balling up"   Complete by: As directed    Notify physician for vaginal bleeding   Complete by: As directed    PRETERM LABOR:  Includes any of the follwing symptoms that occur between 20 - [redacted] weeks gestation.  If these symptoms are not stopped, preterm labor can result in preterm delivery, placing your baby at risk   Complete by: As directed      Allergies as of 10/27/2020   No Known Allergies     Medication List    TAKE these medications   esomeprazole 20 MG capsule Commonly known as: NexIUM Take 1 capsule (20 mg total) by mouth daily at 12 noon.   multivitamin-prenatal 27-0.8 MG Tabs tablet Take 1 tablet by mouth daily at 12 noon.        Total time spent taking care of this patient: 50 minutes  Signed: [name], CNM  10/27/2020, 1:09 PM

## 2020-10-27 NOTE — Progress Notes (Signed)
Provider performed fern test and wet prep slide to evaluate pt. Discharge/fluid. Provider collected specimen for ROM+ test. RN sent ROM+ and urine specimens to lab for processing.

## 2020-10-28 ENCOUNTER — Encounter: Payer: Medicaid Other | Admitting: Obstetrics & Gynecology

## 2020-10-28 ENCOUNTER — Other Ambulatory Visit: Admission: RE | Admit: 2020-10-28 | Payer: Medicaid Other | Source: Ambulatory Visit

## 2020-11-11 ENCOUNTER — Encounter: Payer: Self-pay | Admitting: Obstetrics and Gynecology

## 2020-11-11 ENCOUNTER — Ambulatory Visit (INDEPENDENT_AMBULATORY_CARE_PROVIDER_SITE_OTHER): Payer: Medicaid Other | Admitting: Obstetrics and Gynecology

## 2020-11-11 ENCOUNTER — Other Ambulatory Visit: Payer: Self-pay

## 2020-11-11 ENCOUNTER — Other Ambulatory Visit: Payer: Medicaid Other

## 2020-11-11 VITALS — BP 126/74 | Wt 275.0 lb

## 2020-11-11 DIAGNOSIS — O99212 Obesity complicating pregnancy, second trimester: Secondary | ICD-10-CM

## 2020-11-11 DIAGNOSIS — O0992 Supervision of high risk pregnancy, unspecified, second trimester: Secondary | ICD-10-CM

## 2020-11-11 DIAGNOSIS — Z131 Encounter for screening for diabetes mellitus: Secondary | ICD-10-CM

## 2020-11-11 DIAGNOSIS — Z3A26 26 weeks gestation of pregnancy: Secondary | ICD-10-CM

## 2020-11-11 NOTE — Progress Notes (Signed)
  Routine Prenatal Care Visit  Subjective  Anne Rogers is a 24 y.o. G5P1031 at [redacted]w[redacted]d being seen today for ongoing prenatal care.  She is currently monitored for the following issues for this high-risk pregnancy and has Gall stones; Supervision of high risk pregnancy in first trimester; Obesity affecting pregnancy in first trimester; Adult BMI 45.0-49.9 kg/sq m (HCC); [redacted] weeks gestation of pregnancy; and Vaginal discharge during pregnancy in second trimester on their problem list.  ----------------------------------------------------------------------------------- Patient reports no complaints.   Contractions: Not present. Vag. Bleeding: None.  Movement: Present. Leaking Fluid denies.  ----------------------------------------------------------------------------------- The following portions of the patient's history were reviewed and updated as appropriate: allergies, current medications, past family history, past medical history, past social history, past surgical history and problem list. Problem list updated.  Objective  Blood pressure 126/74, weight 275 lb (124.7 kg), last menstrual period 05/12/2020. Pregravid weight 252 lb (114.3 kg) Total Weight Gain 23 lb (10.4 kg) Urinalysis: Urine Protein    Urine Glucose    Fetal Status: Fetal Heart Rate (bpm): 135 Fundal Height: 27 cm Movement: Present     General:  Alert, oriented and cooperative. Patient is in no acute distress.  Skin: Skin is warm and dry. No rash noted.   Cardiovascular: Normal heart rate noted  Respiratory: Normal respiratory effort, no problems with respiration noted  Abdomen: Soft, gravid, appropriate for gestational age. Pain/Pressure: Absent     Pelvic:  Cervical exam deferred        Extremities: Normal range of motion.  Edema: None  Mental Status: Normal mood and affect. Normal behavior. Normal judgment and thought content.   Assessment   23 y.o. [id number] at [redacted]w[redacted]d by  02/16/2021, by Last Menstrual Period presenting  for routine prenatal visit  Plan   Pregnancy#5 Problems (from 05/12/20 to present)    No problems associated with this episode.       Preterm labor symptoms and general obstetric precautions including but not limited to vaginal bleeding, contractions, leaking of fluid and fetal movement were reviewed in detail with the patient. Please refer to After Visit Summary for other counseling recommendations.   - 28 wk labs today, rh+ -anesthesia consult 3rd trimester  Return in about 2 weeks (around 11/25/2020) for U/S growth and Routine Prenatal Appointment (2-3 weeks).   Thomasene Mohair, MD, [facility], [facility] 11/11/2020 2:20 PM

## 2020-11-12 LAB — 28 WEEK RH+PANEL
Basophils Absolute: 0 10*3/uL (ref 0.0–0.2)
Basos: 0 %
EOS (ABSOLUTE): 0.1 10*3/uL (ref 0.0–0.4)
Eos: 1 %
Gestational Diabetes Screen: 80 mg/dL (ref 65–139)
HIV Screen 4th Generation wRfx: NONREACTIVE
Hematocrit: 33.9 % — ABNORMAL LOW (ref 34.0–46.6)
Hemoglobin: 11.7 g/dL (ref 11.1–15.9)
Immature Grans (Abs): 0.2 10*3/uL — ABNORMAL HIGH (ref 0.0–0.1)
Immature Granulocytes: 2 %
Lymphocytes Absolute: 2 10*3/uL (ref 0.7–3.1)
Lymphs: 17 %
MCH: 31.4 pg (ref 26.6–33.0)
MCHC: 34.5 g/dL (ref 31.5–35.7)
MCV: 91 fL (ref 79–97)
Monocytes Absolute: 0.9 10*3/uL (ref 0.1–0.9)
Monocytes: 8 %
Neutrophils Absolute: 8.8 10*3/uL — ABNORMAL HIGH (ref 1.4–7.0)
Neutrophils: 72 %
Platelets: 244 10*3/uL (ref 150–450)
RBC: 3.73 x10E6/uL — ABNORMAL LOW (ref 3.77–5.28)
RDW: 12.3 % (ref 11.7–15.4)
RPR Ser Ql: NONREACTIVE
WBC: 12 10*3/uL — ABNORMAL HIGH (ref 3.4–10.8)

## 2020-11-13 ENCOUNTER — Encounter: Payer: Medicaid Other | Admitting: Obstetrics and Gynecology

## 2020-11-27 ENCOUNTER — Other Ambulatory Visit: Payer: Self-pay

## 2020-11-27 ENCOUNTER — Encounter: Payer: Self-pay | Admitting: Advanced Practice Midwife

## 2020-11-27 ENCOUNTER — Ambulatory Visit (INDEPENDENT_AMBULATORY_CARE_PROVIDER_SITE_OTHER): Payer: Medicaid Other | Admitting: Advanced Practice Midwife

## 2020-11-27 ENCOUNTER — Ambulatory Visit (INDEPENDENT_AMBULATORY_CARE_PROVIDER_SITE_OTHER): Payer: Medicaid Other

## 2020-11-27 VITALS — BP 126/74 | Wt 282.0 lb

## 2020-11-27 DIAGNOSIS — O0992 Supervision of high risk pregnancy, unspecified, second trimester: Secondary | ICD-10-CM | POA: Diagnosis not present

## 2020-11-27 DIAGNOSIS — O99213 Obesity complicating pregnancy, third trimester: Secondary | ICD-10-CM

## 2020-11-27 DIAGNOSIS — Z3A28 28 weeks gestation of pregnancy: Secondary | ICD-10-CM

## 2020-11-27 DIAGNOSIS — O99212 Obesity complicating pregnancy, second trimester: Secondary | ICD-10-CM | POA: Diagnosis not present

## 2020-11-27 DIAGNOSIS — O0993 Supervision of high risk pregnancy, unspecified, third trimester: Secondary | ICD-10-CM

## 2020-11-27 NOTE — Progress Notes (Signed)
No vb. No lof. Growth scan today. 

## 2020-11-27 NOTE — Patient Instructions (Signed)

## 2020-11-27 NOTE — Progress Notes (Signed)
  Routine Prenatal Care Visit  Subjective  Anne Rogers is a 24 y.o. G5P1031 at [redacted]w[redacted]d being seen today for ongoing prenatal care.  She is currently monitored for the following issues for this high-risk pregnancy and has Gall stones; Supervision of high risk pregnancy in first trimester; Obesity affecting pregnancy in first trimester; Adult BMI 45.0-49.9 kg/sq m (HCC); [redacted] weeks gestation of pregnancy; and Vaginal discharge during pregnancy in second trimester on their problem list.  ----------------------------------------------------------------------------------- Patient reports no complaints.  We discussed results of growth scan. Contractions: Not present. Vag. Bleeding: None.  Movement: Present. Leaking Fluid denies.  ----------------------------------------------------------------------------------- The following portions of the patient's history were reviewed and updated as appropriate: allergies, current medications, past family history, past medical history, past social history, past surgical history and problem list. Problem list updated.  Objective  Blood pressure 126/74, weight 282 lb (127.9 kg), last menstrual period 05/12/2020. Pregravid weight 252 lb (114.3 kg) Total Weight Gain 30 lb (13.6 kg) Urinalysis: Urine Protein    Urine Glucose    Fetal Status: Fetal Heart Rate (bpm): 138 Fundal Height: 30 cm Movement: Present      Growth scan: 66.5%, AC 85.6%, 3# 1oz, AFI 22.2 cm  General:  Alert, oriented and cooperative. Patient is in no acute distress.  Skin: Skin is warm and dry. No rash noted.   Cardiovascular: Normal heart rate noted  Respiratory: Normal respiratory effort, no problems with respiration noted  Abdomen: Soft, gravid, appropriate for gestational age. Pain/Pressure: Absent     Pelvic:  Cervical exam deferred        Extremities: Normal range of motion.  Edema: Trace  Mental Status: Normal mood and affect. Normal behavior. Normal judgment and thought content.    Assessment   24 y.o. [id number] at [redacted]w[redacted]d by  02/16/2021, by Last Menstrual Period presenting for routine prenatal visit  Plan     Preterm labor symptoms and general obstetric precautions including but not limited to vaginal bleeding, contractions, leaking of fluid and fetal movement were reviewed in detail with the patient. Please refer to After Visit Summary for other counseling recommendations.   Return in about 2 weeks (around 12/11/2020) for rob.  Tresea Mall, CNM 11/27/2020 2:27 PM

## 2020-12-11 ENCOUNTER — Encounter: Payer: Medicaid Other | Admitting: Obstetrics

## 2020-12-14 ENCOUNTER — Other Ambulatory Visit: Payer: Self-pay

## 2020-12-14 ENCOUNTER — Ambulatory Visit (INDEPENDENT_AMBULATORY_CARE_PROVIDER_SITE_OTHER): Payer: Medicaid Other | Admitting: Obstetrics

## 2020-12-14 VITALS — BP 126/60 | Wt 282.0 lb

## 2020-12-14 DIAGNOSIS — Z23 Encounter for immunization: Secondary | ICD-10-CM

## 2020-12-14 DIAGNOSIS — Z3A32 32 weeks gestation of pregnancy: Secondary | ICD-10-CM

## 2020-12-14 DIAGNOSIS — O0991 Supervision of high risk pregnancy, unspecified, first trimester: Secondary | ICD-10-CM

## 2020-12-14 DIAGNOSIS — O0993 Supervision of high risk pregnancy, unspecified, third trimester: Secondary | ICD-10-CM

## 2020-12-14 DIAGNOSIS — M7918 Myalgia, other site: Secondary | ICD-10-CM

## 2020-12-14 DIAGNOSIS — Z3A30 30 weeks gestation of pregnancy: Secondary | ICD-10-CM

## 2020-12-14 LAB — POCT URINALYSIS DIPSTICK OB
Glucose, UA: NEGATIVE
POC,PROTEIN,UA: NEGATIVE

## 2020-12-14 MED ORDER — CYCLOBENZAPRINE HCL 10 MG PO TABS: 10.0000 mg | ORAL_TABLET | Freq: Three times a day (TID) | ORAL | 0 refills | Status: DC | PRN

## 2020-12-14 NOTE — Progress Notes (Signed)
Routine Prenatal Care Visit  Subjective  Anne Rogers is a 24 y.o. G5P1031 at [redacted]w[redacted]d being seen today for ongoing prenatal care.  She is currently monitored for the following issues for this high-risk pregnancy and has Gall stones; Supervision of high risk pregnancy in first trimester; Obesity affecting pregnancy in first trimester; Adult BMI 45.0-49.9 kg/sq m (HCC); [redacted] weeks gestation of pregnancy; and Vaginal discharge during pregnancy in second trimester on their problem list.  ----------------------------------------------------------------------------------- Patient reports multiple complaints about pain. She c/o pelvic pressure, back pain. No contractions, vaginal bleeding or LOF. Has gained 30 lbs during the pregnancy, and has BMI of 48. Not workingDoes have maternity belt but not wearing.Strong smell of MJ in the exam room.   .  .   [name] denies.  ----------------------------------------------------------------------------------- The following portions of the patient's history were reviewed and updated as appropriate: allergies, current medications, past family history, past medical history, past social history, past surgical history and problem list. Problem list updated.  Objective  Blood pressure 126/60, weight 282 lb (127.9 kg), last menstrual period 05/12/2020. Pregravid weight 252 lb (114.3 kg) Total Weight Gain 30 lb (13.6 kg) Urinalysis: Urine Protein    Urine Glucose    Fetal Status:           General:  Alert, oriented and cooperative. Patient is in no acute distress.  Skin: Skin is warm and dry. No rash noted.   Cardiovascular: Normal heart rate noted  Respiratory: Normal respiratory effort, no problems with respiration noted  Abdomen: Soft, gravid, appropriate for gestational age.       Pelvic:  Cervical exam deferred        Extremities: Normal range of motion.     Mental Status: Normal mood and affect. Normal behavior. Normal judgment and thought content.    Assessment   24 y.o. [id number] at [redacted]w[redacted]d by  02/16/2021, by Last Menstrual Period presenting for routine prenatal visit  Plan   Pregnancy#5 Problems (from 05/12/20 to present)    Problem Noted Resolved   Supervision of high risk pregnancy in first trimester 06/18/2020 by Tresea Mall, CNM No   Overview Addendum 12/14/2020  8:29 AM by [name], CNM    Clinic [facility] Prenatal Labs  Dating LMP=7wk ultrasound Blood type: A/Positive/-- (07/07 1552)   Genetic Screen 1 Screen:    AFP:     Quad:     NIPS: diploid XX Antibody:Negative (07/07 1552)  Anatomic [place] 9/22 incomplete, needs repeat Rubella: 1.25 (07/07 1552)  Varicella: Non immune  GTT Early:     42           Third trimester:  RPR: Non Reactive (07/07 1552)   Rhogam  NA HBsAg: Negative (07/07 1552)   Vaccines TDAP:                       Flu Shot: HIV: Non Reactive (07/07 1552)   Baby Food Breast prefers to pump                            GBS:   GC/CT:   Contraception  Pap: 06/18/20  CBB     CS/VBAC NA   Support Person Partner [name]          Previous Version       Preterm labor symptoms and general obstetric precautions including but not limited to vaginal bleeding, contractions, leaking of fluid and fetal movement were  reviewed in detail with the patient. Please refer to After Visit Summary for other counseling recommendations.  Discussed working not to gain more weight. She drinks soda and sweet tea all day. Advised to drink water or diet beverages only, and to cut back on rice, pasta, breads and potatoes. Aslo strongly encouraged to walk daily. Wear maternity belt. Rx for limited Flexeril. Grwoth scan in 2 weeks.  Return in about 2 weeks (around 12/28/2020) for return OB.  [name], CNM  12/14/2020 8:46 AM

## 2020-12-14 NOTE — Addendum Note (Signed)
Addended by: Loran Senters D on: 12/14/2020 09:04 AM   Modules accepted: Orders

## 2020-12-14 NOTE — Progress Notes (Signed)
C/o stomach hurts all over and back; can't bend over without getting sharp pain; not sleeping well; feels like baby is scratching pelvic bone.rj

## 2020-12-26 NOTE — L&D Delivery Note (Signed)
Delivery Note Primary OB: [facility] Delivery Physician: Annamarie Major, MD Gestational Age: Full term Antepartum complications: none Intrapartum complications: None  A viable Female was delivered via vertex presentation. "Anne Rogers" Apgars:9 ,9  Weight:  pending .   Placenta status: spontaneous and Intact.  Cord: 3+ vessels;  with the following complications: none.  Anesthesia:  epidural Episiotomy:  none Lacerations:  none Suture Repair: none Est. Blood Loss (mL):  Minimal  Mom to postpartum.  Baby to Couplet care / Skin to Skin.  Annamarie Major, MD, [facility], [facility] 02/09/2021  7:52 PM [phone]

## 2020-12-29 ENCOUNTER — Other Ambulatory Visit: Payer: Self-pay

## 2020-12-29 ENCOUNTER — Encounter: Payer: Self-pay | Admitting: Obstetrics and Gynecology

## 2020-12-29 ENCOUNTER — Ambulatory Visit (INDEPENDENT_AMBULATORY_CARE_PROVIDER_SITE_OTHER): Payer: Medicaid Other | Admitting: Obstetrics and Gynecology

## 2020-12-29 ENCOUNTER — Ambulatory Visit (INDEPENDENT_AMBULATORY_CARE_PROVIDER_SITE_OTHER): Payer: Medicaid Other

## 2020-12-29 VITALS — BP 134/72 | Ht 64.0 in | Wt 278.2 lb

## 2020-12-29 DIAGNOSIS — Z3A32 32 weeks gestation of pregnancy: Secondary | ICD-10-CM | POA: Diagnosis not present

## 2020-12-29 DIAGNOSIS — Z3A33 33 weeks gestation of pregnancy: Secondary | ICD-10-CM

## 2020-12-29 DIAGNOSIS — O0993 Supervision of high risk pregnancy, unspecified, third trimester: Secondary | ICD-10-CM | POA: Diagnosis not present

## 2020-12-29 DIAGNOSIS — O99213 Obesity complicating pregnancy, third trimester: Secondary | ICD-10-CM

## 2020-12-29 NOTE — Progress Notes (Addendum)
Routine Prenatal Care Visit  Subjective  Anne Rogers is a 25 y.o. G5P1031 at [redacted]w[redacted]d being seen today for ongoing prenatal care.  She is currently monitored for the following issues for this high-risk pregnancy and has Gall stones; Supervision of high-risk pregnancy, third trimester; Obesity affecting pregnancy in third trimester; BMI 40.0-44.9, adult (HCC); and Vaginal discharge during pregnancy in second trimester on their problem list.  ----------------------------------------------------------------------------------- Patient reports no complaints.   Contractions: Irregular. Vag. Bleeding: None.  Movement: Present. Denies leaking of fluid.  ----------------------------------------------------------------------------------- The following portions of the patient's history were reviewed and updated as appropriate: allergies, current medications, past family history, past medical history, past social history, past surgical history and problem list. Problem list updated.   Objective  Blood pressure 134/72, height 5\' 4"  (1.626 m), weight 278 lb 3.2 oz (126.2 kg), last menstrual period 05/12/2020. Pregravid weight 252 lb (114.3 kg) Total Weight Gain 26 lb 3.2 oz (11.9 kg) Urinalysis:      Fetal Status:     Movement: Present     Growth 05/14/2020: Fetal presentation is complete breech.  Placenta: anterior. Grade: 2 AFI: 9.5 cm. AC: 91.0%. Growth percentile 58.7%  General:  Alert, oriented and cooperative. Patient is in no acute distress.  Skin: Skin is warm and dry. No rash noted.   Cardiovascular: Normal heart rate noted  Respiratory: Normal respiratory effort, no problems with respiration noted  Abdomen: Soft, gravid, appropriate for gestational age. Pain/Pressure: Present     Pelvic:  Cervical exam deferred        Extremities: Normal range of motion.     ental Status: Normal mood and affect. Normal behavior. Normal judgment and thought content.     Assessment   25 y.o. 30 at [redacted]w[redacted]d  by  02/16/2021, by Last Menstrual Period presenting for routine prenatal visit  Plan   Pregnancy#5 Problems (from 05/12/20 to present)    Problem Noted Resolved   Supervision of high risk pregnancy in first trimester 06/18/2020 by 06/20/2020, CNM No   Overview Addendum 12/14/2020  8:53 AM by 12/16/2020, CNM    Clinic [facility] Prenatal Labs  Dating LMP=7wk ultrasound Blood type: A/Positive/-- (07/07 1552)   Genetic Screen 1 Screen:    AFP:     Quad:     NIPS: diploid XX Antibody:Negative (07/07 1552)  Anatomic 08-06-1985 9/22 incomplete, needs repeat Rubella: 1.25 (07/07 1552)  Varicella: Non immune  GTT Early:     42           Third trimester:  RPR: Non Reactive (07/07 1552)   Rhogam  NA HBsAg: Negative (07/07 1552)   Vaccines TDAP:      12/15/20                 Flu Shot: HIV: Non Reactive (07/07 1552)   Baby Food Breast prefers to pump                            GBS:   GC/CT:   Contraception  Pap: 06/18/20  CBB     CS/VBAC NA   Support Person Partner 06/20/20          Previous Version      Reviewed growth scan results. Plan for antenatal surveillance reviewed.  Preterm labor precautions including but not limited to vaginal bleeding, contractions, leaking of fluid and fetal movement were reviewed in detail with the patient.    Return in about  1 week (around 01/05/2021) for 1 week for ROB NST/AFI (schedule once a week for next two weeks), 3 weeks ROB with Growth/NST.  [name], CNM, MSN [facility], [facility] 12/29/2020, 7:53 PM

## 2021-01-04 ENCOUNTER — Other Ambulatory Visit: Payer: Self-pay

## 2021-01-04 ENCOUNTER — Ambulatory Visit (INDEPENDENT_AMBULATORY_CARE_PROVIDER_SITE_OTHER): Payer: Medicaid Other | Admitting: Obstetrics and Gynecology

## 2021-01-04 ENCOUNTER — Ambulatory Visit (INDEPENDENT_AMBULATORY_CARE_PROVIDER_SITE_OTHER): Payer: Medicaid Other

## 2021-01-04 VITALS — BP 122/72 | Wt 278.0 lb

## 2021-01-04 DIAGNOSIS — O0993 Supervision of high risk pregnancy, unspecified, third trimester: Secondary | ICD-10-CM

## 2021-01-04 DIAGNOSIS — Z3A33 33 weeks gestation of pregnancy: Secondary | ICD-10-CM

## 2021-01-04 DIAGNOSIS — O99213 Obesity complicating pregnancy, third trimester: Secondary | ICD-10-CM

## 2021-01-04 LAB — FETAL NONSTRESS TEST

## 2021-01-04 NOTE — Progress Notes (Signed)
Routine Prenatal Care Visit  Subjective  Anne Rogers is a 25 y.o. G5P1031 at [redacted]w[redacted]d being seen today for ongoing prenatal care.  She is currently monitored for the following issues for this high-risk pregnancy and has Gall stones; Supervision of high-risk pregnancy, third trimester; Obesity affecting pregnancy in third trimester; BMI 40.0-44.9, adult (HCC); and Vaginal discharge during pregnancy in second trimester on their problem list.  ----------------------------------------------------------------------------------- Patient reports no complaints.   Contractions: Irregular. Vag. Bleeding: None.  Movement: Present. Denies leaking of fluid.  ----------------------------------------------------------------------------------- The following portions of the patient's history were reviewed and updated as appropriate: allergies, current medications, past family history, past medical history, past social history, past surgical history and problem list. Problem list updated.   Objective  Blood pressure 122/72, weight 278 lb (126.1 kg), last menstrual period 05/12/2020. Pregravid weight 252 lb (114.3 kg) Total Weight Gain 26 lb (11.8 kg) Urinalysis:      Fetal Status: Fetal Heart Rate (bpm): 145   Movement: Present  Presentation: Vertex  General:  Alert, oriented and cooperative. Patient is in no acute distress.  Skin: Skin is warm and dry. No rash noted.   Cardiovascular: Normal heart rate noted  Respiratory: Normal respiratory effort, no problems with respiration noted  Abdomen: Soft, gravid, appropriate for gestational age. Pain/Pressure: Present     Pelvic:  Cervical exam deferred        Extremities: Normal range of motion.  Edema: None  ental Status: Normal mood and affect. Normal behavior. Normal judgment and thought content.    US OB Limited  Result Date: 01/04/2021 Patient Name: Anne Rogers DOB: February 27, 1996 MRN: [id number] ULTRASOUND REPORT Location: [facility] Date of  Service: 01/04/2021 Indications:AFI Findings: Mason Jim intrauterine pregnancy is visualized with FHR at 132 BPM. Fetal presentation is Cephalic. Placenta: anterior. Grade: 2 AFI: 11.9 cm Impression: 1. [redacted]w[redacted]d Viable Singleton Intrauterine pregnancy dated by previously established criteria. 2. AFI is 11.9 cm. Recommendations: 1.Clinical correlation with the patient's History and Physical Exam. Deanna Artis, RT There is a singleton gestation with normal amniotic fluid volume. The visualized fetal anatomy appears within normal limits within the resolution of ultrasound as described above.  It must be noted that a normal ultrasound is unable to rule out fetal aneuploidy.  Vena Austria, MD, [facility], [facility] 01/04/2021, 3:02 PM   US OB Follow Up  Result Date: 12/29/2020 Patient Name: Anne Rogers DOB: [birth year] MRN: [id number] ULTRASOUND REPORT Location: [facility] Date of Service: 12/29/2020 Indications:growth/afi Findings: Mason Jim intrauterine pregnancy is visualized with FHR at 147 BPM. Biometrics give an (U/S) Gestational age of [redacted]w[redacted]d and an (U/S) EDD of 02/11/2021; this correlates with the clinically established Estimated Date of Delivery: 02/16/21. Fetal presentation is complete breech. Placenta: anterior. Grade: 2 AFI: 9.5 cm Growth percentile is 58.7%.  AC percentile is 91.0%. EFW: 2285 g ( 5 lb 1 oz ) Impression: 1. [redacted]w[redacted]d Viable Singleton Intrauterine pregnancy previously established criteria. 2. Growth is 58.7 %ile.  AFI is 9.5 cm. Recommendations: 1.Clinical correlation with the patient's History and Physical Exam. 2. Repeat US for growth and position in 4 weeks Deanna Artis, RT I have reviewed this ultrasound and the report. I agree with the above assessment and plan. Adelene Idler MD [facility] 12/29/20 4:34 PM    Baseline: 145 Variability: moderate Accelerations: present Decelerations: absent Tocometry: N/A The  patient was monitored for 30 minutes, fetal heart rate tracing was deemed reactive, category I  tracing,  CPT [phone]  Assessment   25 y.o. 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 Antibody:Negative (07/07 1552)  Anatomic US  wnl Rubella: 1.25 (07/07 1552)  Varicella: Non immune  GTT Early:     42           Third trimester: 80 RPR: Non Reactive (07/07 1552)   Rhogam  NA HBsAg: Negative (07/07 1552)   Vaccines TDAP:      12/15/20                 Flu Shot: declined HIV: Non Reactive (07/07 1552)   Baby Food Breast prefers to pump                            GBS:   GC/CT:   Contraception  Pap: 06/18/20  CBB     CS/VBAC NA   Support Person Partner [name]          Previous Version   Obesity affecting pregnancy in third trimester 06/18/2020 by Tresea Mall, CNM No   Overview Addendum 12/29/2020  7:52 PM by [name], CNM    BMI >=40 [x ] early 1h gtt - 42 [ ]  screen sleep apnea [x ] u/s for dating [x ]  [x ] nutritional goals [x ] folic acid 1mg  [x ] bASA (>12 weeks) [ ]  consider nutrition consult [ ]  consider maternal EKG 1st trimester [x ] Growth u/s 28 [ ] , 32 [x] , 36 weeks [ ]  [x ] NST/AFI weekly 34+ weeks (34[] ,35[] ,36[] , 37[] , 38[] , 39[] , 40[] ) [x ] IOL by 41 weeks (scheduled, prn [] )       Previous Version       Gestational age appropriate obstetric precautions including but not limited to vaginal bleeding, contractions, leaking of fluid and fetal movement were reviewed in detail  with the patient.    - reactive NST normal AFI  Return in about 1 week (around 01/11/2021) for ROB, NST, AFI.  , MD, [facility], [facility] 01/04/2021, 3:16 PM

## 2021-01-11 ENCOUNTER — Encounter: Payer: Medicaid Other | Admitting: Obstetrics

## 2021-01-11 ENCOUNTER — Ambulatory Visit: Payer: Medicaid Other

## 2021-01-11 DIAGNOSIS — O0993 Supervision of high risk pregnancy, unspecified, third trimester: Secondary | ICD-10-CM

## 2021-01-11 DIAGNOSIS — O99213 Obesity complicating pregnancy, third trimester: Secondary | ICD-10-CM

## 2021-01-13 ENCOUNTER — Ambulatory Visit (INDEPENDENT_AMBULATORY_CARE_PROVIDER_SITE_OTHER): Payer: Medicaid Other | Admitting: Obstetrics and Gynecology

## 2021-01-13 ENCOUNTER — Ambulatory Visit (INDEPENDENT_AMBULATORY_CARE_PROVIDER_SITE_OTHER): Payer: Medicaid Other

## 2021-01-13 ENCOUNTER — Other Ambulatory Visit: Payer: Self-pay

## 2021-01-13 VITALS — BP 120/70 | Ht 64.0 in | Wt 279.4 lb

## 2021-01-13 DIAGNOSIS — O0993 Supervision of high risk pregnancy, unspecified, third trimester: Secondary | ICD-10-CM | POA: Diagnosis not present

## 2021-01-13 DIAGNOSIS — O99213 Obesity complicating pregnancy, third trimester: Secondary | ICD-10-CM | POA: Diagnosis not present

## 2021-01-13 DIAGNOSIS — Z3A35 35 weeks gestation of pregnancy: Secondary | ICD-10-CM

## 2021-01-13 LAB — FETAL NONSTRESS TEST

## 2021-01-13 LAB — POCT URINALYSIS DIPSTICK OB
Glucose, UA: NEGATIVE
POC,PROTEIN,UA: NEGATIVE

## 2021-01-13 NOTE — Progress Notes (Signed)
Routine Prenatal Care Visit  Subjective  Anne Rogers is a 25 y.o. G5P1031 at [redacted]w[redacted]d being seen today for ongoing prenatal care.  She is currently monitored for the following issues for this high-risk pregnancy and has Gall stones; Supervision of high-risk pregnancy, third trimester; Obesity affecting pregnancy in third trimester; BMI 40.0-44.9, adult (HCC); and Vaginal discharge during pregnancy in second trimester on their problem list.  ----------------------------------------------------------------------------------- Patient reports hip and pelvic pain..   Contractions: Irregular. Vag. Bleeding: None.  Movement: Present. Denies leaking of fluid.  ----------------------------------------------------------------------------------- The following portions of the patient's history were reviewed and updated as appropriate: allergies, current medications, past family history, past medical history, past social history, past surgical history and problem list. Problem list updated.   Objective  Blood pressure 120/70, height 5\' 4"  (1.626 m), weight 279 lb 6.4 oz (126.7 kg), last menstrual period 05/12/2020. Pregravid weight 252 lb (114.3 kg) Total Weight Gain 27 lb 6.4 oz (12.4 kg) Urinalysis:      Fetal Status:     Movement: Present      NONSTRESS TEST INTERPRETATION  INDICATIONS: BMI >45 FHR baseline: 130 RESULTS:  A NST procedure was performed with FHR monitoring and a normal baseline established, appropriate time of 20-40 minutes of evaluation, and accels >2 seen w 15x15 characteristics.  Results show a REACTIVE NST.    General:  Alert, oriented and cooperative. Patient is in no acute distress.  Skin: Skin is warm and dry. No rash noted.   Cardiovascular: Normal heart rate noted  Respiratory: Normal respiratory effort, no problems with respiration noted  Abdomen: Soft, gravid, appropriate for gestational age. Pain/Pressure: Present     Pelvic:  Cervical exam deferred         Extremities: Normal range of motion.     ental Status: Normal mood and affect. Normal behavior. Normal judgment and thought content.     Assessment   25 y.o. 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 Antibody:Negative (07/07 1552)  Anatomic 08-[birth year]-1983  wnl Rubella: 1.25 (07/07 1552)  Varicella: Non immune  GTT Early:     42           Third trimester: 80 RPR: Non Reactive (07/07 1552)   Rhogam  NA HBsAg: Negative (07/07 1552)   Vaccines TDAP:      12/15/20                 Flu Shot: declined HIV: Non Reactive (07/07 1552)   Baby Food Breast prefers to pump                            GBS:   GC/CT:   Contraception  Pap: 06/18/20  CBB     CS/VBAC NA   Support Person Partner 06/20/20          Previous Version   Obesity affecting pregnancy in third trimester 06/18/2020 by 06/20/2020, CNM No   Overview Addendum 12/29/2020  7:52  PM by [name], CNM    BMI >=40 [x ] early 1h gtt - 42 [ ]  screen sleep apnea [x ] u/s for dating [x ]  [x ] nutritional goals [x ] folic acid 1mg  [x ] bASA (>12 weeks) [ ]  consider nutrition consult [ ]  consider maternal EKG 1st trimester [x ] Growth u/s 28 [ ] , 32 [x] , 36 weeks [ ]  [x ] NST/AFI weekly 34+ weeks (34[] ,35[] ,36[] , 37[] , 38[] , 39[] , 40[] ) [x ] IOL by 41 weeks (scheduled, prn [] )       Previous Version      -Discussed GBS swab at next visit -Reviewed AFI and NST -Patient with questions regarding visitation at time of delivery - updated regarding current  Covid-19 practices.  Preterm labor precautions including but not limited to vaginal bleeding, contractions, leaking of fluid and fetal movement were reviewed in detail with the patient.    Return in about 1 week (around 01/20/2021) for ROB with Growth US/AFI, NST.  , CNM, MSN [facility], [facility] 01/13/2021, 4:51 PM

## 2021-01-19 ENCOUNTER — Ambulatory Visit (INDEPENDENT_AMBULATORY_CARE_PROVIDER_SITE_OTHER): Payer: Medicaid Other

## 2021-01-19 ENCOUNTER — Other Ambulatory Visit (HOSPITAL_COMMUNITY)
Admission: RE | Admit: 2021-01-19 | Discharge: 2021-01-19 | Disposition: A | Discharge status: Home / Self Care | Payer: Medicaid Other | Source: Ambulatory Visit | Attending: Advanced Practice Midwife | Admitting: Advanced Practice Midwife

## 2021-01-19 ENCOUNTER — Encounter: Payer: Self-pay | Admitting: Advanced Practice Midwife

## 2021-01-19 ENCOUNTER — Other Ambulatory Visit: Payer: Self-pay

## 2021-01-19 ENCOUNTER — Ambulatory Visit (INDEPENDENT_AMBULATORY_CARE_PROVIDER_SITE_OTHER): Payer: Medicaid Other | Admitting: Advanced Practice Midwife

## 2021-01-19 VITALS — BP 120/80 | Wt 283.0 lb

## 2021-01-19 DIAGNOSIS — Z113 Encounter for screening for infections with a predominantly sexual mode of transmission: Secondary | ICD-10-CM | POA: Insufficient documentation

## 2021-01-19 DIAGNOSIS — Z3A36 36 weeks gestation of pregnancy: Secondary | ICD-10-CM

## 2021-01-19 DIAGNOSIS — O0993 Supervision of high risk pregnancy, unspecified, third trimester: Secondary | ICD-10-CM | POA: Diagnosis not present

## 2021-01-19 DIAGNOSIS — Z369 Encounter for antenatal screening, unspecified: Secondary | ICD-10-CM

## 2021-01-19 DIAGNOSIS — O99213 Obesity complicating pregnancy, third trimester: Secondary | ICD-10-CM | POA: Diagnosis not present

## 2021-01-19 DIAGNOSIS — Z3685 Encounter for antenatal screening for Streptococcus B: Secondary | ICD-10-CM

## 2021-01-19 NOTE — Patient Instructions (Signed)
Pain Relief During Labor and Delivery Many things can cause pain during labor and delivery, including:  Pressure due to the baby moving through the pelvis.  Stretching of tissues due to the baby moving through the birth canal.  Muscle tension due to anxiety or nervousness.  The uterus tightening (contracting)and relaxing to help move the baby. How do I get pain relief during labor and delivery? Discuss your pain relief options with your health care provider during your prenatal visits. Explore the options offered by your hospital or birth center. There are many ways to deal with the pain of labor and delivery. You can try relaxation techniques or doing relaxing activities, taking a warm shower or bath (hydrotherapy), or other methods. There are also many medicines available to help control pain. Relaxation techniques and activities Practice relaxation techniques or do relaxing activities, such as:  Focused breathing.  Meditation.  Visualization.  Aroma therapy.  Listening to your favorite music.  Hypnosis. Hydrotherapy Take a warm shower or bath. This may:  Provide comfort and relaxation.  Lessen your feeling of pain.  Reduce the amount of pain medicine needed.  Shorten the length of labor. Other methods Try doing other things, such as:  Getting a massage or having counterpressure on your back.  Applying warm packs or ice packs.  Changing positions often, moving around, or using a birthing ball. Medicines You may be given:  Pain medicine through an IV or an injection into a muscle.  Pain medicine inserted into your spinal column.  Injections of sterile water just under the skin on your lower back.  Nitrous oxide inhalation therapy, also called laughing gas.   What kinds of medicine are available for pain relief? There are two kinds of medicines that can be used to relieve pain during labor and delivery:  Analgesics. These medicines decrease pain without  causing you to lose feeling or the ability to move your muscles.  Anesthetics. These medicines block feeling in the body and can decrease your ability to move freely. Both kinds of medicine can cause minor side effects, such as nausea, trouble concentrating, and sleepiness. They can also affect the baby's heart rate before birth and his or her breathing after birth. For this reason, health care providers are careful about when and how much medicine is given. Which medicines are used to provide pain relief? Common medicines The most common medicines used to help manage pain during labor and delivery include:  Opioids. Opioids are medicines that decrease how much pain you feel (perception of pain). These medicines can be given through an IV or may be used with anesthetics to block pain.  Epidural analgesia. ? Epidural analgesia is given through a very thin tube that is inserted into the lower back. Medicine is delivered continuously to the area near your spinal column nerves (epidural space). After having this treatment, you may be able to move your legs, but you will not be able to walk. Depending on the amount and type of medicine given, you may lose all feeling in the lower half of your body, or you may have some sensation, including the urge to push. This treatment can be used to give pain relief for a vaginal birth. ? Sometimes, a numbing medicine is injected into the spinal fluid when an epidural catheter is placed. This provides for immediate relief but only lasts for 1-2 hours. Once it wears off, the epidural will provide pain relief. This is called a combined spinal-epidural (CSE) block.  Intrathecal analgesia (  spinal analgesia). Intrathecal analgesia is similar to epidural analgesia, but the medicine is injected into the spinal fluid instead of the epidural space. It is usually only given once. It starts to relieve pain quickly, but the pain relief lasts only 1-2 hours.  Pudendal block. This  block is done by injecting numbing medicine through the wall of the vagina and into a nerve in the pelvis. Other medicines Other medicines used to help manage pain during labor and delivery include:  Local anesthetics. These are used to numb a small area of the body. They may be used along with another kind of medicine or used to numb the nerves of the vagina, cervix, and perineum during the second stage of labor.  Spinal block (spinal anesthesia). Spinal anesthesia is similar to spinal analgesia, but the medicine that is used contains longer-acting numbing medicines and pain medicines. This type of anesthesia can be used for a cesarean delivery and allows you to stay awake for the birth of your baby.  General anesthetics cause you to lose consciousness so you do not feel pain. They are usually only used for an emergency cesarean delivery. These medicines are given through an IV or a mask or both. These medicines are used as part of a procedure or for an emergency delivery. Summary  Women have many options to help them manage the pain associated with labor and delivery.  You can try doing relaxing activities, taking a warm shower or bath, or other methods.  There are also many medicines available to help control pain during labor and delivery.  Talk with your health care provider about what options are available to you. This information is not intended to replace advice given to you by your health care provider. Make sure you discuss any questions you have with your health care provider. Document Revised: 10/30/2019 Document Reviewed: 10/30/2019 Elsevier Patient Education  2021 [employer] Vaginal Delivery  Vaginal delivery means that you give birth by pushing your baby out of your birth canal (vagina). A team of health care providers will help you before, during, and after vaginal delivery. Birth experiences are unique for every woman and every pregnancy, and birth experiences vary  depending on where you choose to give birth. What happens when I arrive at the birth center or hospital? Once you are in labor and have been admitted into the hospital or birth center, your health care provider may:  Review your pregnancy history and any concerns that you have.  Insert an IV into one of your veins. This may be used to give you fluids and medicines.  Check your blood pressure, pulse, temperature, and heart rate (vital signs).  Check whether your bag of water (amniotic sac) has broken (ruptured).  Talk with you about your birth plan and discuss pain control options. Monitoring Your health care provider may monitor your contractions (uterine monitoring) and your baby's heart rate (fetal monitoring). You may need to be monitored:  Often, but not continuously (intermittently).  All the time or for long periods at a time (continuously). Continuous monitoring may be needed if: ? You are taking certain medicines, such as medicine to relieve pain or make your contractions stronger. ? You have pregnancy or labor complications. Monitoring may be done by:  Placing a special stethoscope or a handheld monitoring device on your abdomen to check your baby's heartbeat and to check for contractions.  Placing monitors on your abdomen (external monitors) to record your baby's heartbeat and the frequency and length   of contractions.  Placing monitors inside your uterus through your vagina (internal monitors) to record your baby's heartbeat and the frequency, length, and strength of your contractions. Depending on the type of monitor, it may remain in your uterus or on your baby's head until birth.  Telemetry. This is a type of continuous monitoring that can be done with external or internal monitors. Instead of having to stay in bed, you are able to move around during telemetry. Physical exam Your health care provider may perform frequent physical exams. This may include:  Checking how  and where your baby is positioned in your uterus.  Checking your cervix to determine: ? Whether it is thinning out (effacing). ? Whether it is opening up (dilating). What happens during labor and delivery? Normal labor and delivery is divided into the following three stages: Stage 1  This is the longest stage of labor.  This stage can last for hours or days.  Throughout this stage, you will feel contractions. Contractions generally feel mild, infrequent, and irregular at first. They get stronger, more frequent (about every 2-3 minutes), and more regular as you move through this stage.  This stage ends when your cervix is completely dilated to 4 inches (10 cm) and completely effaced. Stage 2  This stage starts once your cervix is completely effaced and dilated and lasts until the delivery of your baby.  This stage may last from 20 minutes to 2 hours.  This is the stage where you will feel an urge to push your baby out of your vagina.  You may feel stretching and burning pain, especially when the widest part of your baby's head passes through the vaginal opening (crowning).  Once your baby is delivered, the umbilical cord will be clamped and cut. This usually occurs after waiting a period of 1-2 minutes after delivery.  Your baby will be placed on your bare chest (skin-to-skin contact) in an upright position and covered with a warm blanket. Watch your baby for feeding cues, like rooting or sucking, and help the baby to your breast for his or her first feeding. Stage 3  This stage starts immediately after the birth of your baby and ends after you deliver the placenta.  This stage may take anywhere from 5 to 30 minutes.  After your baby has been delivered, you will feel contractions as your body expels the placenta and your uterus contracts to control bleeding.   What can I expect after labor and delivery?  After labor is over, you and your baby will be monitored closely until you  are ready to go home to ensure that you are both healthy. Your health care team will teach you how to care for yourself and your baby.  You and your baby will stay in the same room (rooming in) during your hospital stay. This will encourage early bonding and successful breastfeeding.  You may continue to receive fluids and medicines through an IV.  Your uterus will be checked and massaged regularly (fundal massage).  You will have some soreness and pain in your abdomen, vagina, and the area of skin between your vaginal opening and your anus (perineum).  If an incision was made near your vagina (episiotomy) or if you had some vaginal tearing during delivery, cold compresses may be placed on your episiotomy or your tear. This helps to reduce pain and swelling.  You may be given a squirt bottle to use instead of wiping when you go to the bathroom. To use   the squirt bottle, follow these steps: ? Before you urinate, fill the squirt bottle with warm water. Do not use hot water. ? After you urinate, while you are sitting on the toilet, use the squirt bottle to rinse the area around your urethra and vaginal opening. This rinses away any urine and blood. ? Fill the squirt bottle with clean water every time you use the bathroom.  It is normal to have vaginal bleeding after delivery. Wear a sanitary pad for vaginal bleeding and discharge. Summary  Vaginal delivery means that you will give birth by pushing your baby out of your birth canal (vagina).  Your health care provider may monitor your contractions (uterine monitoring) and your baby's heart rate (fetal monitoring).  Your health care provider may perform a physical exam.  Normal labor and delivery is divided into three stages.  After labor is over, you and your baby will be monitored closely until you are ready to go home. This information is not intended to replace advice given to you by your health care provider. Make sure you discuss any  questions you have with your health care provider. Document Revised: 01/16/2018 Document Reviewed: 01/16/2018 Elsevier Patient Education  2021 [employer] Inc[employer]  

## 2021-01-19 NOTE — Progress Notes (Signed)
Routine Prenatal Care Visit  Subjective  Anne Rogers is a 25 y.o. G5P1031 at [redacted]w[redacted]d being seen today for ongoing prenatal care.  She is currently monitored for the following issues for this high-risk pregnancy and has Gall stones; Supervision of high-risk pregnancy, third trimester; Obesity affecting pregnancy in third trimester; BMI 40.0-44.9, adult (HCC); and Vaginal discharge during pregnancy in second trimester on their problem list.  ----------------------------------------------------------------------------------- Patient reports increased swelling in extremities. She has some headaches in the mornings she thinks due to lack of sleep. They are relieved by tylenol.   Contractions: Irregular. Vag. Bleeding: None.  Movement: Present. Leaking Fluid denies.  ----------------------------------------------------------------------------------- The following portions of the patient's history were reviewed and updated as appropriate: allergies, current medications, past family history, past medical history, past social history, past surgical history and problem list. Problem list updated.  Objective  Blood pressure 120/80, weight 283 lb (128.4 kg), last menstrual period 05/12/2020. Pregravid weight 252 lb (114.3 kg) Total Weight Gain 31 lb (14.1 kg) Urinalysis: Urine Protein    Urine Glucose    Fetal Status: Fetal Heart Rate (bpm): 130   Movement: Present  Presentation: Vertex   Growth/AFI: 72.9%, AC 97.4%, 7 pounds 0 ounces, AFI 9 cm, cephalic NST: reactive 20 minute tracing, baseline 130 bpm, moderate variability, +accelerations, -decelerations  General:  Alert, oriented and cooperative. Patient is in no acute distress.  Skin: Skin is warm and dry. No rash noted.   Cardiovascular: Normal heart rate noted  Respiratory: Normal respiratory effort, no problems with respiration noted  Abdomen: Soft, gravid, appropriate for gestational age. Pain/Pressure: Present     Pelvic:  Cervical exam  deferred      GBS/aptima collected  Extremities: Normal range of motion.  Edema: Trace  Mental Status: Normal mood and affect. Normal behavior. Normal judgment and thought content.   Assessment   25 y.o. [name] at [redacted]w[redacted]d by  02/16/2021, by Last Menstrual Period presenting for routine prenatal visit  Plan   Pregnancy#5 Problems (from 05/12/20 to present)    Problem Noted Resolved   BMI 40.0-44.9, adult (HCC) 07/22/2020 by Farrel Conners, CNM No   Supervision of high-risk pregnancy, third trimester 06/18/2020 by Tresea Mall, CNM No   Overview Addendum 12/29/2020  7:53 PM by [name], CNM    Clinic [facility] Prenatal Labs  Dating LMP=7wk ultrasound Blood type: A/Positive/-- (07/07 1552)   Genetic Screen  NIPS: diploid XX Antibody:Negative (07/07 1552)  Anatomic US  wnl Rubella: 1.25 (07/07 1552)  Varicella: Non immune  GTT Early:     42           Third trimester: 80 RPR: Non Reactive (07/07 1552)   Rhogam  NA HBsAg: Negative (07/07 1552)   Vaccines TDAP:      12/15/20                 Flu Shot: declined HIV: Non Reactive (07/07 1552)   Baby Food Breast prefers to pump                            GBS:   GC/CT:   Contraception  Pap: 06/18/20  CBB     CS/VBAC NA   Support Person Partner [name]          Previous Version   Obesity affecting pregnancy in third trimester 06/18/2020 by Tresea Mall, CNM No   Overview Addendum 12/29/2020  7:52 PM by [name], CNM    BMI >=40 [  x ] early 1h gtt - 42 [ ]  screen sleep apnea [x ] u/s for dating [x ]  [x ] nutritional goals [x ] folic acid 1mg  [x ] bASA (>12 weeks) [ ]  consider nutrition consult [ ]  consider maternal EKG 1st trimester [x ] Growth u/s 28 [ ] , 32 [x] , 36 weeks [ ]  [x ] NST/AFI weekly 34+ weeks (34[] ,35[] ,36[] , 37[] , 38[] , 39[] , 40[] ) [x ] IOL by 41 weeks (scheduled, prn [] )       Previous Version       Preterm labor symptoms and general obstetric precautions including but not limited to vaginal bleeding,  contractions, leaking of fluid and fetal movement were reviewed in detail with the patient.   Return in about 1 week (around 01/26/2021) for afi, nst, rob.  , CNM 01/19/2021 2:29 PM

## 2021-01-21 LAB — STREP GP B NAA: Strep Gp B NAA: NEGATIVE

## 2021-01-21 LAB — CERVICOVAGINAL ANCILLARY ONLY
Chlamydia: NEGATIVE
Comment: NEGATIVE
Comment: NEGATIVE
Comment: NORMAL
Neisseria Gonorrhea: NEGATIVE
Trichomonas: NEGATIVE

## 2021-01-27 ENCOUNTER — Other Ambulatory Visit: Payer: Self-pay

## 2021-01-27 ENCOUNTER — Ambulatory Visit (INDEPENDENT_AMBULATORY_CARE_PROVIDER_SITE_OTHER): Payer: Medicaid Other | Admitting: Obstetrics

## 2021-01-27 DIAGNOSIS — Z3A37 37 weeks gestation of pregnancy: Secondary | ICD-10-CM

## 2021-01-27 DIAGNOSIS — O0993 Supervision of high risk pregnancy, unspecified, third trimester: Secondary | ICD-10-CM

## 2021-01-27 LAB — FETAL NONSTRESS TEST

## 2021-01-27 NOTE — Telephone Encounter (Signed)
Pt would like to schedule an appointment for decreased fetal movement

## 2021-01-27 NOTE — Progress Notes (Signed)
Pt states she feel like the baby is not moving as much.

## 2021-01-27 NOTE — Telephone Encounter (Signed)
Patient is scheduled for 01/27/21 with MMF
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
## 2021-02-02 ENCOUNTER — Encounter
Admission: RE | Admit: 2021-02-02 | Discharge: 2021-02-02 | Disposition: A | Discharge status: Home / Self Care | Payer: Medicaid Other | Source: Ambulatory Visit | Attending: Anesthesiology | Admitting: Anesthesiology

## 2021-02-02 ENCOUNTER — Other Ambulatory Visit: Payer: Self-pay

## 2021-02-02 NOTE — Consult Note (Signed)
Anesthesiology consult note ( Ambulatory referral to OB anesthesia for morbid obesity) :  I had the distinct pleasure of meeting Anne Rogers today for an OB anesthesia precheck.  She has a history of morbid obesity with a BMI today of 49.  She is [redacted] weeks pregnant.  Patient reports no previous problems with anesthesia, she reports some back pain after her last labor and has a MP score of 2.  We discussed the risks of Obesity in Pregnancy including but not limited to: Marland Kitchen Patients with a BMI > 40 are at increased risk for complications during pregnancy, such as hypertensive disorders of pregnancy, gestational diabetes, obstructive sleep apnea, thromboembolic disease, prolonged labor, operative vaginal delivery and need for cesarean delivery. . There is an increased risk of airway complications, including inability to place a breathing tube, should an emergency cesarean delivery be required. . There is an increased risk of aspiration, where stomach contents get into the lungs and can cause inflammation, infection and even respiratory failure. Marland Kitchen Epidural placement is more difficult in obesity, often requires multiple attempts, more often results in inadequate pain relief, more often requires replacement due to movement of the epidural catheter and more often results in accidental dural puncture and post dural puncture headache.  I explained to the patient that we would follow our Obesity in Pregnancy Protocol: . An anesthesiology consultation is recommended for all patients with a pre-pregnancy BMI ? 45. o During this consultation the anesthesiologist will ask you questions about your medical history as well as do a physical exam that specifically looks for features that are predictive of complications of anesthesia.  o The anesthesiologist will also review with you potential complications of anesthesia that are specifically increased due to obesity during pregnancy.   Patient voiced understanding and  signed our Obesity in Pregnancy form acknowledging that we had discussed her pathway forward.

## 2021-02-03 ENCOUNTER — Encounter: Payer: Self-pay | Admitting: Obstetrics and Gynecology

## 2021-02-03 ENCOUNTER — Ambulatory Visit (INDEPENDENT_AMBULATORY_CARE_PROVIDER_SITE_OTHER): Payer: Medicaid Other

## 2021-02-03 ENCOUNTER — Encounter: Payer: Medicaid Other | Admitting: Obstetrics & Gynecology

## 2021-02-03 ENCOUNTER — Ambulatory Visit (INDEPENDENT_AMBULATORY_CARE_PROVIDER_SITE_OTHER): Payer: Medicaid Other | Admitting: Obstetrics and Gynecology

## 2021-02-03 VITALS — BP 113/73 | Wt 283.0 lb

## 2021-02-03 DIAGNOSIS — O0993 Supervision of high risk pregnancy, unspecified, third trimester: Secondary | ICD-10-CM | POA: Diagnosis not present

## 2021-02-03 DIAGNOSIS — O99213 Obesity complicating pregnancy, third trimester: Secondary | ICD-10-CM

## 2021-02-03 DIAGNOSIS — Z3A38 38 weeks gestation of pregnancy: Secondary | ICD-10-CM

## 2021-02-03 NOTE — Progress Notes (Signed)
Routine Prenatal Care Visit  Subjective  Anne Rogers is a 25 y.o. G5P1031 at [redacted]w[redacted]d being seen today for ongoing prenatal care.  She is currently monitored for the following issues for this high-risk pregnancy and has Gall stones; Supervision of high-risk pregnancy, third trimester; Obesity affecting pregnancy in third trimester; BMI 40.0-44.9, adult (HCC); and Vaginal discharge during pregnancy in second trimester on their problem list.  ----------------------------------------------------------------------------------- Patient reports no complaints.   Contractions: Irregular. Vag. Bleeding: None.  Movement: Present. Leaking Fluid denies.  ----------------------------------------------------------------------------------- The following portions of the patient's history were reviewed and updated as appropriate: allergies, current medications, past family history, past medical history, past social history, past surgical history and problem list. Problem list updated.  Objective  Blood pressure 113/73, weight 283 lb (128.4 kg), last menstrual period 05/12/2020. Pregravid weight 252 lb (114.3 kg) Total Weight Gain 31 lb (14.1 kg) Urinalysis: Urine Protein    Urine Glucose    Fetal Status: Fetal Heart Rate (bpm): 140 (NST)   Movement: Present  Presentation: Vertex  General:  Alert, oriented and cooperative. Patient is in no acute distress.  Skin: Skin is warm and dry. No rash noted.   Cardiovascular: Normal heart rate noted  Respiratory: Normal respiratory effort, no problems with respiration noted  Abdomen: Soft, gravid, appropriate for gestational age. Pain/Pressure: Absent     Pelvic:  Cervical exam deferred        Extremities: Normal range of motion.     Mental Status: Normal mood and affect. Normal behavior. Normal judgment and thought content.   NST: Baseline FHR: 140 beats/min Variability: moderate Accelerations: present Decelerations: absent Tocometry: not  done  Interpretation:  INDICATIONS: maternal obesity, BMI > 40 RESULTS:  A NST procedure was performed with FHR monitoring and a normal baseline established, appropriate time of 20-40 minutes of evaluation, and accels >2 seen w 15x15 characteristics.  Results show a REACTIVE NST.    Imaging Results US OB Limited  Result Date: 02/03/2021 Patient Name: Anne Rogers DOB: [birth year] MRN: [id number] ULTRASOUND REPORT Location: [facility] Date of Service: 02/03/2021 Indications:AFI Findings: Mason Jim intrauterine pregnancy is visualized with FHR at 140 BPM. Fetal presentation is Cephalic. Placenta: anterior. Grade: 2 AFI: 9.1 cm Impression: 1. [redacted]w[redacted]d Viable Singleton Intrauterine pregnancy dated by previously established criteria. 2. AFI is 9.1 cm. Anne Rogers, RT The ultrasound images and findings were reviewed by me and I agree with the above report. Thomasene Mohair, MD, [facility], [facility] 02/03/2021 4:03 PM     Assessment   24 y.o. [id number] at [redacted]w[redacted]d by  02/16/2021, by Last Menstrual Period presenting for routine prenatal visit  Plan   Pregnancy#5 Problems (from 05/12/20 to present)    Problem Noted Resolved   BMI 40.0-44.9, adult (HCC) 07/22/2020 by Farrel Conners, CNM No   Supervision of high-risk pregnancy, third trimester 06/18/2020 by Tresea Mall, CNM No   Overview Addendum 01/27/2021 11:20 AM by [name], CNM    Clinic [facility] Prenatal Labs  Dating LMP=7wk ultrasound Blood type: A/Positive/-- (07/07 1552)   Genetic Screen  NIPS: diploid XX Antibody:Negative (07/07 1552)  Anatomic US  wnl Rubella: 1.25 (07/07 1552)  Varicella: Non immune  GTT Early:     42           Third trimester: 80 RPR: Non Reactive (07/07 1552)   Rhogam  NA HBsAg: Negative (07/07 1552)   Vaccines TDAP:      12/15/20  Flu Shot: declined HIV: Non Reactive (07/07 1552)   Baby Food Breast prefers to pump                            GBS:  negative GC/CT:    Contraception  Pap: 06/18/20  CBB     CS/VBAC NA   Support Person Partner Anne Rogers          Previous Version   Obesity affecting pregnancy in third trimester 06/18/2020 by Tresea Mall, CNM No   Overview Addendum 12/29/2020  7:52 PM by [name], CNM    BMI >=40 [x ] early 1h gtt - 42 [ ]  screen sleep apnea [x ] u/s for dating [x ]  [x ] nutritional goals [x ] folic acid 1mg  [x ] bASA (>12 weeks) [ ]  consider nutrition consult [ ]  consider maternal EKG 1st trimester [x ] Growth u/s 28 [ ] , 32 [x] , 36 weeks [ ]  [x ] NST/AFI weekly 34+ weeks (34[] ,35[] ,36[] , 37[] , 38[] , 39[] , 40[] ) [x ] IOL by 41 weeks (scheduled, prn [] )       Previous Version       Term labor symptoms and general obstetric precautions including but not limited to vaginal bleeding, contractions, leaking of fluid and fetal movement were reviewed in detail with the patient. Please refer to After Visit Summary for other counseling recommendations.   Return in 6 days (on 02/09/2021) for Induction of labor at 0800AM.   , MD, OB/GYN, [facility] 02/03/2021 4:30 PM

## 2021-02-03 NOTE — Patient Instructions (Addendum)
Induction Information: Your induction date: 02/09/2021 at 0800 Covid test: 2/11 between 9-10AM. [facility] to [facility] drive-through.  Wear a mask and stay in your car.   This should not take long.  On your induction date, go to the ER a little before your induction time and let them know you're there for your labor induction.

## 2021-02-05 ENCOUNTER — Other Ambulatory Visit: Payer: Medicaid Other

## 2021-02-05 ENCOUNTER — Other Ambulatory Visit: Payer: Self-pay

## 2021-02-05 DIAGNOSIS — Z20822 Contact with and (suspected) exposure to covid-19: Secondary | ICD-10-CM

## 2021-02-06 LAB — NOVEL CORONAVIRUS, NAA: SARS-CoV-2, NAA: NOT DETECTED

## 2021-02-06 LAB — SARS-COV-2, NAA 2 DAY TAT

## 2021-02-09 ENCOUNTER — Inpatient Hospital Stay: Payer: Medicaid Other | Admitting: Anesthesiology

## 2021-02-09 ENCOUNTER — Inpatient Hospital Stay
Admission: RE | Admit: 2021-02-09 | Discharge: 2021-02-11 | DRG: 807 | Disposition: A | Discharge status: Home / Self Care | Payer: Medicaid Other | Attending: Obstetrics & Gynecology | Admitting: Obstetrics & Gynecology

## 2021-02-09 ENCOUNTER — Encounter: Payer: Self-pay | Admitting: Obstetrics & Gynecology

## 2021-02-09 ENCOUNTER — Other Ambulatory Visit: Payer: Self-pay

## 2021-02-09 DIAGNOSIS — F17210 Nicotine dependence, cigarettes, uncomplicated: Secondary | ICD-10-CM | POA: Diagnosis present

## 2021-02-09 DIAGNOSIS — Z3A39 39 weeks gestation of pregnancy: Secondary | ICD-10-CM | POA: Diagnosis not present

## 2021-02-09 DIAGNOSIS — O99334 Smoking (tobacco) complicating childbirth: Principal | ICD-10-CM | POA: Diagnosis present

## 2021-02-09 DIAGNOSIS — O26893 Other specified pregnancy related conditions, third trimester: Secondary | ICD-10-CM | POA: Diagnosis present

## 2021-02-09 DIAGNOSIS — Z3490 Encounter for supervision of normal pregnancy, unspecified, unspecified trimester: Secondary | ICD-10-CM | POA: Diagnosis present

## 2021-02-09 DIAGNOSIS — O0993 Supervision of high risk pregnancy, unspecified, third trimester: Secondary | ICD-10-CM

## 2021-02-09 DIAGNOSIS — Z9189 Other specified personal risk factors, not elsewhere classified: Secondary | ICD-10-CM

## 2021-02-09 DIAGNOSIS — O99213 Obesity complicating pregnancy, third trimester: Secondary | ICD-10-CM

## 2021-02-09 DIAGNOSIS — Z6841 Body Mass Index (BMI) 40.0 and over, adult: Secondary | ICD-10-CM

## 2021-02-09 LAB — CBC
HCT: 35.2 % — ABNORMAL LOW (ref 36.0–46.0)
Hemoglobin: 12.4 g/dL (ref 12.0–15.0)
MCH: 31.7 pg (ref 26.0–34.0)
MCHC: 35.2 g/dL (ref 30.0–36.0)
MCV: 90 fL (ref 80.0–100.0)
Platelets: 249 10*3/uL (ref 150–400)
RBC: 3.91 MIL/uL (ref 3.87–5.11)
RDW: 14 % (ref 11.5–15.5)
WBC: 11.1 10*3/uL — ABNORMAL HIGH (ref 4.0–10.5)
nRBC: 0 % (ref 0.0–0.2)

## 2021-02-09 LAB — TYPE AND SCREEN
ABO/RH(D): A POS
Antibody Screen: NEGATIVE

## 2021-02-09 MED ORDER — OXYCODONE-ACETAMINOPHEN 5-325 MG PO TABS
1.0000 | ORAL_TABLET | ORAL | Status: DC | PRN
  Administered 2021-02-10: 1 via ORAL
  Filled 2021-02-09: qty 1

## 2021-02-09 MED ORDER — MISOPROSTOL 25 MCG QUARTER TABLET
25.0000 ug | ORAL_TABLET | ORAL | Status: DC | PRN
  Administered 2021-02-09: 25 ug via VAGINAL
  Filled 2021-02-09 (×2): qty 1

## 2021-02-09 MED ORDER — LACTATED RINGERS IV SOLN: INTRAVENOUS | Status: DC

## 2021-02-09 MED ORDER — SENNOSIDES-DOCUSATE SODIUM 8.6-50 MG PO TABS
2.0000 | ORAL_TABLET | ORAL | Status: DC
  Administered 2021-02-10 – 2021-02-11 (×3): 2 via ORAL
  Filled 2021-02-09 (×3): qty 2

## 2021-02-09 MED ORDER — COCONUT OIL OIL
1.0000 "application " | TOPICAL_OIL | Status: DC | PRN
  Filled 2021-02-09: qty 120

## 2021-02-09 MED ORDER — TERBUTALINE SULFATE 1 MG/ML IJ SOLN: 0.2500 mg | Freq: Once | INTRAMUSCULAR | Status: DC | PRN

## 2021-02-09 MED ORDER — SIMETHICONE 80 MG PO CHEW
80.0000 mg | CHEWABLE_TABLET | ORAL | Status: DC | PRN
  Administered 2021-02-10: 80 mg via ORAL
  Filled 2021-02-09: qty 1

## 2021-02-09 MED ORDER — OXYCODONE-ACETAMINOPHEN 5-325 MG PO TABS: 1.0000 | ORAL_TABLET | ORAL | Status: DC | PRN

## 2021-02-09 MED ORDER — ONDANSETRON HCL 4 MG/2ML IJ SOLN: 4.0000 mg | INTRAMUSCULAR | Status: DC | PRN

## 2021-02-09 MED ORDER — BUTORPHANOL TARTRATE 1 MG/ML IJ SOLN
1.0000 mg | INTRAMUSCULAR | Status: DC | PRN
  Administered 2021-02-09: 1 mg via INTRAVENOUS
  Filled 2021-02-09: qty 1

## 2021-02-09 MED ORDER — WITCH HAZEL-GLYCERIN EX PADS
1.0000 "application " | MEDICATED_PAD | CUTANEOUS | Status: DC | PRN
  Filled 2021-02-09 (×2): qty 100

## 2021-02-09 MED ORDER — EPHEDRINE 5 MG/ML INJ
10.0000 mg | INTRAVENOUS | Status: DC | PRN
  Filled 2021-02-09: qty 2

## 2021-02-09 MED ORDER — LIDOCAINE HCL (PF) 1 % IJ SOLN: 30.0000 mL | INTRAMUSCULAR | Status: DC | PRN

## 2021-02-09 MED ORDER — SODIUM CHLORIDE 0.9 % IV SOLN
250.0000 mL | INTRAVENOUS | Status: DC | PRN
Start: 2021-02-09 — End: 2021-02-09

## 2021-02-09 MED ORDER — SOD CITRATE-CITRIC ACID 500-334 MG/5ML PO SOLN: 30.0000 mL | ORAL | Status: DC | PRN

## 2021-02-09 MED ORDER — LACTATED RINGERS IV SOLN: 500.0000 mL | INTRAVENOUS | Status: DC | PRN

## 2021-02-09 MED ORDER — SENNOSIDES-DOCUSATE SODIUM 8.6-50 MG PO TABS: 2.0000 | ORAL_TABLET | ORAL | Status: DC

## 2021-02-09 MED ORDER — LIDOCAINE HCL (PF) 1 % IJ SOLN
INTRAMUSCULAR | Status: DC | PRN
  Administered 2021-02-09: 2 mL

## 2021-02-09 MED ORDER — ONDANSETRON HCL 4 MG PO TABS: 4.0000 mg | ORAL_TABLET | ORAL | Status: DC | PRN

## 2021-02-09 MED ORDER — BUPIVACAINE HCL (PF) 0.25 % IJ SOLN
INTRAMUSCULAR | Status: DC | PRN
  Administered 2021-02-09: 3 mL via EPIDURAL
  Administered 2021-02-09: 4 mL via EPIDURAL

## 2021-02-09 MED ORDER — ONDANSETRON HCL 4 MG/2ML IJ SOLN: 4.0000 mg | Freq: Four times a day (QID) | INTRAMUSCULAR | Status: DC | PRN

## 2021-02-09 MED ORDER — FENTANYL 2.5 MCG/ML W/ROPIVACAINE 0.15% IN NS 100 ML EPIDURAL (ARMC)
EPIDURAL | Status: AC
  Filled 2021-02-09: qty 100

## 2021-02-09 MED ORDER — FENTANYL 2.5 MCG/ML W/ROPIVACAINE 0.15% IN NS 100 ML EPIDURAL (ARMC)
12.0000 mL/h | EPIDURAL | Status: DC
  Administered 2021-02-09: 12 mL/h via EPIDURAL

## 2021-02-09 MED ORDER — LACTATED RINGERS IV SOLN
500.0000 mL | Freq: Once | INTRAVENOUS | Status: AC
  Administered 2021-02-09: 500 mL via INTRAVENOUS

## 2021-02-09 MED ORDER — LIDOCAINE HCL (PF) 1 % IJ SOLN
INTRAMUSCULAR | Status: AC
  Filled 2021-02-09: qty 30

## 2021-02-09 MED ORDER — BENZOCAINE-MENTHOL 20-0.5 % EX AERO
1.0000 "application " | INHALATION_SPRAY | CUTANEOUS | Status: DC | PRN
  Filled 2021-02-09: qty 56

## 2021-02-09 MED ORDER — OXYTOCIN 10 UNIT/ML IJ SOLN
INTRAMUSCULAR | Status: AC
  Filled 2021-02-09: qty 2

## 2021-02-09 MED ORDER — OXYCODONE-ACETAMINOPHEN 5-325 MG PO TABS: 2.0000 | ORAL_TABLET | ORAL | Status: DC | PRN

## 2021-02-09 MED ORDER — DIBUCAINE (PERIANAL) 1 % EX OINT
1.0000 "application " | TOPICAL_OINTMENT | CUTANEOUS | Status: DC | PRN
  Filled 2021-02-09 (×2): qty 28

## 2021-02-09 MED ORDER — LIDOCAINE-EPINEPHRINE (PF) 1.5 %-1:200000 IJ SOLN
INTRAMUSCULAR | Status: DC | PRN
  Administered 2021-02-09: 3 mL via PERINEURAL

## 2021-02-09 MED ORDER — SODIUM CHLORIDE 0.9% FLUSH: 3.0000 mL | Freq: Two times a day (BID) | INTRAVENOUS | Status: DC

## 2021-02-09 MED ORDER — IBUPROFEN 600 MG PO TABS
600.0000 mg | ORAL_TABLET | Freq: Four times a day (QID) | ORAL | Status: DC
  Administered 2021-02-09 – 2021-02-11 (×7): 600 mg via ORAL
  Filled 2021-02-09 (×7): qty 1

## 2021-02-09 MED ORDER — ACETAMINOPHEN 325 MG PO TABS
650.0000 mg | ORAL_TABLET | ORAL | Status: DC | PRN
  Administered 2021-02-09 – 2021-02-10 (×3): 650 mg via ORAL
  Filled 2021-02-09 (×3): qty 2

## 2021-02-09 MED ORDER — SODIUM CHLORIDE 0.9% FLUSH: 3.0000 mL | INTRAVENOUS | Status: DC | PRN

## 2021-02-09 MED ORDER — ZOLPIDEM TARTRATE 5 MG PO TABS
5.0000 mg | ORAL_TABLET | Freq: Every evening | ORAL | Status: DC | PRN
Start: 2021-02-09 — End: 2021-02-11

## 2021-02-09 MED ORDER — MISOPROSTOL 200 MCG PO TABS
ORAL_TABLET | ORAL | Status: AC
  Filled 2021-02-09: qty 4

## 2021-02-09 MED ORDER — PHENYLEPHRINE 40 MCG/ML (10ML) SYRINGE FOR IV PUSH (FOR BLOOD PRESSURE SUPPORT)
80.0000 ug | PREFILLED_SYRINGE | INTRAVENOUS | Status: DC | PRN
  Filled 2021-02-09: qty 10

## 2021-02-09 MED ORDER — OXYTOCIN BOLUS FROM INFUSION: 333.0000 mL | Freq: Once | INTRAVENOUS | Status: DC

## 2021-02-09 MED ORDER — OXYTOCIN-SODIUM CHLORIDE 30-0.9 UT/500ML-% IV SOLN
1.0000 m[IU]/min | INTRAVENOUS | Status: DC
  Administered 2021-02-09: 1 m[IU]/min via INTRAVENOUS
  Filled 2021-02-09: qty 500

## 2021-02-09 MED ORDER — DIPHENHYDRAMINE HCL 25 MG PO CAPS
25.0000 mg | ORAL_CAPSULE | Freq: Four times a day (QID) | ORAL | Status: DC | PRN
Start: 2021-02-09 — End: 2021-02-11

## 2021-02-09 MED ORDER — DIPHENHYDRAMINE HCL 50 MG/ML IJ SOLN: 12.5000 mg | INTRAMUSCULAR | Status: DC | PRN

## 2021-02-09 MED ORDER — VARICELLA VIRUS VACCINE LIVE 1350 PFU/0.5ML IJ SUSR
0.5000 mL | Freq: Once | INTRAMUSCULAR | Status: DC
  Filled 2021-02-09: qty 0.5

## 2021-02-09 MED ORDER — AMMONIA AROMATIC IN INHA
RESPIRATORY_TRACT | Status: AC
  Filled 2021-02-09: qty 10

## 2021-02-09 MED ORDER — ACETAMINOPHEN 325 MG PO TABS: 650.0000 mg | ORAL_TABLET | ORAL | Status: DC | PRN

## 2021-02-09 MED ORDER — OXYTOCIN-SODIUM CHLORIDE 30-0.9 UT/500ML-% IV SOLN: 2.5000 [IU]/h | INTRAVENOUS | Status: DC

## 2021-02-09 NOTE — Anesthesia Procedure Notes (Addendum)
Epidural Patient location during procedure: OB Start time: 02/09/2021 3:24 PM End time: 02/09/2021 3:28 PM  Staffing Anesthesiologist: Lenard Simmer, MD Resident/CRNA: Karoline Caldwell, CRNA Performed: resident/CRNA   Preanesthetic Checklist Completed: patient identified, IV checked, site marked, risks and benefits discussed, surgical consent, monitors and equipment checked, pre-op evaluation and timeout performed  Epidural Patient position: sitting Prep: ChloraPrep Patient monitoring: heart rate, continuous pulse ox and blood pressure Approach: midline Location: L3-L4 Injection technique: LOR saline  Needle:  Needle type: Tuohy  Needle gauge: 17 G Needle length: 9 cm and 9 Needle insertion depth: 7 cm Catheter type: closed end flexible Catheter size: 19 Gauge Catheter at skin depth: 12 cm Test dose: negative and 1.5% lidocaine with Epi 1:200 K  Assessment Sensory level: T10 Events: blood not aspirated, injection not painful, no injection resistance, no paresthesia and negative IV test  Additional Notes 1 attempt Pt. Evaluated and documentation done after procedure finished. Patient identified. Risks/Benefits/Options discussed with patient including but not limited to bleeding, infection, nerve damage, paralysis, failed block, incomplete pain control, headache, blood pressure changes, nausea, vomiting, reactions to medication both or allergic, itching and postpartum back pain. Confirmed with bedside nurse the patient's most recent platelet count. Confirmed with patient that they are not currently taking any anticoagulation, have any bleeding history or any family history of bleeding disorders. Patient expressed understanding and wished to proceed. All questions were answered. Sterile technique was used throughout the entire procedure. Please see nursing notes for vital signs. Test dose was given through epidural catheter and negative prior to continuing to dose epidural or start  infusion. Warning signs of high block given to the patient including shortness of breath, tingling/numbness in hands, complete motor block, or any concerning symptoms with instructions to call for help. Patient was given instructions on fall risk and not to get out of bed. All questions and concerns addressed with instructions to call with any issues or inadequate analgesia.   Patient tolerated the insertion well without immediate complications.Reason for block:procedure for pain

## 2021-02-09 NOTE — Progress Notes (Signed)
Anne Rogers is a 25 y.o. G5P1031 at [redacted]w[redacted]d by ultrasound admitted for induction of labor due to Elective at term. She received her first dose of Cytotec at 0845 this morning. Subjective: Anne Rogers describes her contractions as "feeling like menstrual cramps, and also feeling some in her back"She denies any LOF or vaginal bleeding. Her baby is moving well.  Verbalizing her strong desire for an epidural before AROM is performed. Objective: BP 107/69   Pulse (!) 141   Temp 97.7 F (36.5 C) (Oral)   Resp 18   Ht 5\' 3"  (1.6 m)   Wt 128.4 kg   LMP 05/12/2020 (Exact Date)   BMI 50.13 kg/m  No intake/output data recorded. No intake/output data recorded.  FHT:  FHR: 130s baseline bpm, variability: moderate,  accelerations:  Present,  decelerations:  Present occasional brief variable noted with fetal movement that dip to the 110s, with quick return ot the baseline. Multiple accels noted. OVerall Cat 1 UC:   regular, every 4 minutes SVE:   Dilation: 3.5 Effacement (%): 60 Station: -3 Exam by:: [email or web address] CNM   Bishop score is 8  Labs: Lab Results  Component Value Date   WBC 11.1 (H) 02/09/2021   HGB 12.4 02/09/2021   HCT 35.2 (L) 02/09/2021   MCV 90.0 02/09/2021   PLT 249 02/09/2021    Assessment / Plan: Induction of labor due to term with favorable cervix  Labor: Progressing normally  Fetal Wellbeing:  Category II Pain Control:  Labor support without medications and but plans iv meds and epidural I/D:  n/a Anticipated MOD:  NSVD  Will commence with pitocin now. Dr. 02/11/2021 updated on maternal/fetal status.  Tiburcio Pea 02/09/2021, 1:17 PM

## 2021-02-09 NOTE — Discharge Summary (Signed)
Postpartum Discharge Summary  Patient Name: Anne Rogers DOB: Mar 14, 1996 MRN: [id number]  Date of admission: 02/09/2021 Delivery date:02/09/2021  Delivering provider: Gae Dry  Date of discharge: 02/11/2021  Admitting diagnosis: Encounter for elective induction of labor [Z34.90] Intrauterine pregnancy: [redacted]w[redacted]d    Secondary diagnosis:  Active Problems:   Encounter for elective induction of labor   At risk for depressed mood during postpartum period  Additional problems: At risk for depressed mood during postpartum period    Discharge diagnosis: Term Pregnancy Delivered                                              Post partum procedures: none Augmentation: AROM, Pitocin and Cytotec Complications: None  Hospital course: Induction of Labor With Vaginal Delivery   25y.o. yo [id number] admitted to the hospital 02/09/2021 for induction of labor.  Indication for induction: Favorable cervix at term.  Patient had an uncomplicated labor course as follows: Membrane Rupture Time/Date: 4:45 PM ,02/09/2021   Delivery Method:Vaginal, Spontaneous  Episiotomy: None  Lacerations:  None  Details of delivery can be found in separate delivery note.  Patient had a routine postpartum course. Patient is discharged home 02/11/21. Patient with significant psychosocial concerns prior to discharge. Social work consulted.   Reviewed Edinburgh score with patient prior to discharge. Patient answered "hardly ever" to question #10. Patient denies plan or means of harming self. Discussed perinatal mood concerns, options for treatment including talk-based therapy and medication management. Patient declined further discussion around medication management today. Recommended two week postpartum mood check.   Newborn Data: Birth date:02/09/2021  Birth time:7:41 PM  Gender:Female  Living status:Living  Apgars:8 ,9  Weight:4070 g   Magnesium Sulfate received: No BMZ received:  No Rhophylac:No MMR:No T-DaP:Given prenatally Flu: No Transfusion:No  Physical exam  Vitals:   02/10/21 0853 02/10/21 1142 02/11/21 0059 02/11/21 0743  BP: 105/66 112/60 115/71 106/81  Pulse: 80 69 76 77  Resp:  '20 18 20  ' Temp: 97.7 F (36.5 C) 98 F (36.7 C) 98.1 F (36.7 C) 98 F (36.7 C)  TempSrc: Oral Oral Oral Oral  SpO2: 100%  98% 100%  Weight:      Height:       General: alert, cooperative and no distress Lochia: appropriate Uterine Fundus: firm Incision: N/A DVT Evaluation: No evidence of DVT seen on physical exam. No significant calf/ankle edema. Labs: Lab Results  Component Value Date   WBC 14.5 (H) 02/10/2021   HGB 12.0 02/10/2021   HCT 33.7 (L) 02/10/2021   MCV 91.1 02/10/2021   PLT 240 02/10/2021   CMP Latest Ref Rng & Units 07/13/2019  Glucose 70 - 99 mg/dL 91  BUN 6 - 20 mg/dL 9  Creatinine 0.44 - 1.00 mg/dL 0.61  Sodium 135 - 145 mmol/L 140  Potassium 3.5 - 5.1 mmol/L 4.0  Chloride 98 - 111 mmol/L 111  CO2 22 - 32 mmol/L 23  Calcium 8.9 - 10.3 mg/dL 9.0  Total Protein 6.5 - 8.1 g/dL -  Total Bilirubin 0.3 - 1.2 mg/dL -  Alkaline Phos 38 - 126 U/L -  AST 15 - 41 U/L -  ALT 14 - 54 U/L -   Edinburgh Score: Edinburgh Postnatal Depression Scale Screening Tool 02/10/2021  I have been able to laugh and see the funny side of  things. 0  I have looked forward with enjoyment to things. 1  I have blamed myself unnecessarily when things went wrong. 3  I have been anxious or worried for no good reason. 2  I have felt scared or panicky for no good reason. 2  Things have been getting on top of me. 2  I have been so unhappy that I have had difficulty sleeping. 2  I have felt sad or miserable. 2  I have been so unhappy that I have been crying. 2  The thought of harming myself has occurred to me. 1  Edinburgh Postnatal Depression Scale Total 17     After visit meds:  Allergies as of 02/11/2021   No Known Allergies     Medication List    STOP  taking these medications   cyclobenzaprine 10 MG tablet Commonly known as: FLEXERIL     TAKE these medications   acetaminophen 325 MG tablet Commonly known as: Tylenol Take 2 tablets (650 mg total) by mouth every 6 (six) hours as needed (for pain scale < 4).   esomeprazole 20 MG capsule Commonly known as: NexIUM Take 1 capsule (20 mg total) by mouth daily at 12 noon.   ibuprofen 600 MG tablet Commonly known as: ADVIL Take 1 tablet (600 mg total) by mouth every 6 (six) hours.   multivitamin-prenatal 27-0.8 MG Tabs tablet Take 1 tablet by mouth daily at 12 noon.        Discharge home in stable condition Infant Feeding: pumped breastmilk supplemented with formula Infant Disposition:home with mother Discharge instruction: per After Visit Summary and Postpartum booklet. Activity: Advance as tolerated. Pelvic rest for 6 weeks.  Diet: routine diet Anticipated Birth Control: POPs Postpartum Appointment:6 weeks Additional Postpartum F/U: Postpartum Depression checkup Future Appointments:No future appointments. Follow up Visit:  Follow-up Information    Gae Dry, MD. Schedule an appointment as soon as possible for a visit in 6 week(s).   Specialty: Obstetrics and Gynecology Why: Schedule a 6 week postpartum visit with Dr. Kenton Kingfisher Schedule a 2 week mood check with Gigi Gin, CNM Contact information: [address] [place] [address] [phone]                02/11/2021 Orlie Pollen, CNM

## 2021-02-09 NOTE — Progress Notes (Signed)
Pt arrived to L&D for scheduled IOL.  

## 2021-02-09 NOTE — Progress Notes (Signed)
  Labor Progress Note   25 y.o. [id number] @ [redacted]w[redacted]d , admitted for  Pregnancy, Labor Management.   Subjective:  Min pain after epidural  Objective:  BP 115/66   Pulse 91   Temp 98.3 F (36.8 C) (Oral)   Resp 18   Ht 5\' 3"  (1.6 m)   Wt 128.4 kg   LMP 05/12/2020 (Exact Date)   BMI 50.13 kg/m  Abd: gravid, ND, FHT present, mild tenderness on exam Extr: trace to 1+ bilateral pedal edema SVE: CERVIX: 4 cm dilated, 75 effaced, -1 station, presenting part VTX VAGINA: no abnormalities noted MEMBRANES: ruptured, clear fluid  EFM: FHR: 130 bpm, variability: moderate,  accelerations:  Present,  decelerations:  Absent Toco: Frequency: Every 3-5 minutes on Pitocin 7 mU/min Labs: I have reviewed the patient's lab results.   Assessment & Plan:  05/14/2020 @ [redacted]w[redacted]d, admitted for  Pregnancy and Labor/Delivery Management  1. Pain management: epidural. 2. FWB: FHT category 1.  3. ID: GBS negative 4. Labor management: AROM clear now IUPC placed Cont Pitocin  All discussed with patient, see orders  [redacted]w[redacted]d, MD, Annamarie Major Ob/Gyn, [facility] 02/09/2021  4:47 PM

## 2021-02-09 NOTE — H&P (Signed)
Anne Rogers is a 25 y.o. female presenting for induction of labor (elective). OB History    Gravida  5   Para  1   Term  1   Preterm      AB  3   Living  1     SAB      IAB  3   Ectopic      Multiple  0   Live Births  1          Past Medical History:  Diagnosis Date  . Anxiety   . Depression   . Gall stones    Past Surgical History:  Procedure Laterality Date  . ABDOMINAL SURGERY    . INDUCED ABORTION  02/2015  . INDUCED ABORTION     x 2  . INDUCED ABORTION     x 3   Family History: family history is not on file. Social History:  reports that she has been smoking cigarettes. She has a 0.38 pack-year smoking history. She has never used smokeless tobacco. She reports previous alcohol use. She reports that she does not use drugs.     Maternal Diabetes: No Genetic Screening: Normal Maternal Ultrasounds/Referrals: Normal Fetal Ultrasounds or other Referrals:  None Maternal Substance Abuse:  No Significant Maternal Medications:  None Significant Maternal Lab Results:  Group B Strep negative Other Comments:  None  Review of Systems  Constitutional: Negative.   Eyes: Negative.   Respiratory: Negative.   Cardiovascular: Negative.   Gastrointestinal: Negative.        Gravid abdomen  Endocrine: Negative.   Genitourinary: Negative.   Musculoskeletal: Negative.   Skin: Negative.   Allergic/Immunologic: Negative.   Neurological: Negative.   Hematological: Negative.   Psychiatric/Behavioral: Negative.    History Dilation: 2.5 Effacement (%): 30 Station: Ballotable Exam by:: Paula Compton CNM Last menstrual period 05/12/2020. Maternal Exam:  Uterine Assessment: Contraction strength is mild.  Contraction frequency is rare.   Abdomen: Patient reports no abdominal tenderness. Estimated fetal weight is 8.5 lbs.   Fetal presentation: vertex  Introitus: Normal vulva. Normal vagina.  Ferning test: not done.  Nitrazine test: not done.  Pelvis:  adequate for delivery.   Cervix: Cervix evaluated by digital exam.     Physical Exam Constitutional:      Appearance: Normal appearance. She is obese.  HENT:     Head: Normocephalic.     Nose: Nose normal.  Eyes:     Extraocular Movements: Extraocular movements intact.     Pupils: Pupils are equal, round, and reactive to light.  Cardiovascular:     Rate and Rhythm: Normal rate and regular rhythm.     Pulses: Normal pulses.     Heart sounds: Normal heart sounds.  Pulmonary:     Effort: Pulmonary effort is normal.     Breath sounds: Normal breath sounds.  Abdominal:     General: Bowel sounds are normal.     Palpations: Abdomen is soft.     Comments: Gravid uterus  Genitourinary:    General: Normal vulva.     Comments: SVE: 2.5/30%/ballotable Musculoskeletal:        General: Normal range of motion.     Cervical back: Normal range of motion and neck supple.  Skin:    General: Skin is warm and dry.  Neurological:     General: No focal deficit present.     Mental Status: She is alert and oriented to person, place, and time.  Psychiatric:  Mood and Affect: Mood normal.        Behavior: Behavior normal.     Prenatal labs: ABO, Rh: A/Positive/-- (07/07 1552) Antibody: Negative (07/07 1552) Rubella: 1.25 (07/07 1552) RPR: Non Reactive (11/17 1514)  HBsAg: Negative (07/07 1552)  HIV: Non Reactive (11/17 1514)  GBS: Negative/-- (01/25 1430)   Assessment/Plan: IUP at [redacted] weeks gestation for elective IOL. Routine admission orders IV access CBC HIV, RPR Will start with cytotec, then advance to pitocin. Dr. Tiburcio Pea aware of plan of care.  [name], CNM  02/09/2021 9:20 AM     [name] 02/09/2021, 8:59 AM

## 2021-02-09 NOTE — Anesthesia Preprocedure Evaluation (Signed)
Anesthesia Evaluation  Patient identified by MRN, date of birth, ID band Patient awake    Reviewed: Allergy & Precautions, H&P , NPO status , Patient's Chart, lab work & pertinent test results  Airway Mallampati: III  TM Distance: >3 FB Neck ROM: full    Dental no notable dental hx.    Pulmonary Current SmokerPatient did not abstain from smoking.,    Pulmonary exam normal        Cardiovascular negative cardio ROS Normal cardiovascular exam     Neuro/Psych PSYCHIATRIC DISORDERS Anxiety Depression negative neurological ROS     GI/Hepatic Neg liver ROS, GERD  Poorly Controlled and Medicated,  Endo/Other  negative endocrine ROS  Renal/GU negative Renal ROS  negative genitourinary   Musculoskeletal   Abdominal   Peds  Hematology negative hematology ROS (+)   Anesthesia Other Findings   Reproductive/Obstetrics (+) Pregnancy                             Anesthesia Physical Anesthesia Plan  ASA: III  Anesthesia Plan: Epidural   Post-op Pain Management:    Induction:   PONV Risk Score and Plan:   Airway Management Planned:   Additional Equipment:   Intra-op Plan:   Post-operative Plan:   Informed Consent: I have reviewed the patients History and Physical, chart, labs and discussed the procedure including the risks, benefits and alternatives for the proposed anesthesia with the patient or authorized representative who has indicated his/her understanding and acceptance.     Dental Advisory Given  Plan Discussed with: CRNA and Anesthesiologist  Anesthesia Plan Comments:         Anesthesia Quick Evaluation

## 2021-02-10 LAB — CBC
HCT: 33.7 % — ABNORMAL LOW (ref 36.0–46.0)
Hemoglobin: 12 g/dL (ref 12.0–15.0)
MCH: 32.4 pg (ref 26.0–34.0)
MCHC: 35.6 g/dL (ref 30.0–36.0)
MCV: 91.1 fL (ref 80.0–100.0)
Platelets: 240 10*3/uL (ref 150–400)
RBC: 3.7 MIL/uL — ABNORMAL LOW (ref 3.87–5.11)
RDW: 14.1 % (ref 11.5–15.5)
WBC: 14.5 10*3/uL — ABNORMAL HIGH (ref 4.0–10.5)
nRBC: 0 % (ref 0.0–0.2)

## 2021-02-10 LAB — RPR: RPR Ser Ql: NONREACTIVE

## 2021-02-10 NOTE — Anesthesia Postprocedure Evaluation (Signed)
Anesthesia Post Note  Patient: [name]  Procedure(s) Performed: AN AD HOC LABOR EPIDURAL  Patient location during evaluation: Mother Baby Anesthesia Type: Epidural Level of consciousness: awake and alert Pain management: pain level controlled Vital Signs Assessment: post-procedure vital signs reviewed and stable Respiratory status: spontaneous breathing, nonlabored ventilation and respiratory function stable Cardiovascular status: stable Postop Assessment: no headache, no backache and epidural receding Anesthetic complications: no   No complications documented.   Last Vitals:  Vitals:   02/09/21 2321 02/10/21 0300  BP: 130/68 108/63  Pulse: 89 76  Resp: 18 18  Temp: 36.6 C 36.5 C  SpO2: 100% 100%    Last Pain:  Vitals:   02/10/21 0550  TempSrc:   PainSc: 3                  Mansa Willers B Alonza Smoker
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
## 2021-02-10 NOTE — Lactation Note (Signed)
This note was copied from a baby's chart. Lactation Consultation Note  Patient Name: Anne Rogers [id number] Date: 02/10/2021 Reason for consult: Initial assessment;Term;Other (Comment) (pump + bottle feed) Age:27 hours  Initial lactation visit. Mom is G5P2, delivered SVD 15hrs ago after IOL. Mom opts to pump/bottle feed, and provide formula as needed. No desire at this time to put baby to the breast.  Mom has experience with pumping and bottle feeding with her older child and feels more comfortable. Baby has accepted formula well, and was last fed 3hrs ago of 8mL, several stools and 1 void thus far reported by mom. Mom notes pain with pumping overnight, and has not pumped in a while. LC offers a larger flange, size 64mm, and use of coconut oil, and assistance with pump set-up and education on use; mom accepts. LC reviewed pump set-up, use of pump, adjustment of suction level, pumping frequency and consistency and pump cleaning of parts and pieces. LC present for pumping at 10:45, and assisted with adjustments per mom's comfort level, mom reports this feeling much better than it did the last time she attempted to pump. During pump LC did step out of room to obtain a soda/ice, and when returning, mom had already turned the pump off stating she became uncomfortable and tender. LC praised mom for pumping for 10 minutes, and encouraged her to work up to the 15 minute recommendations by adding 1 minute each time she pumps moving forward; mom agreed. LC offered to fax [facility] referral for delivery notification and pump request; mom accepted. LC to follow-up.  Maternal Data Has patient been taught Hand Expression?: Yes Does the patient have breastfeeding experience prior to this delivery?: Yes How long did the patient breastfeed?: pumped for 1st year  Feeding Mother's Current Feeding Choice: Breast Milk and Formula Nipple Type: Slow - flow  LATCH Score                    Lactation  Tools Discussed/Used Tools: Pump;Flanges;Coconut oil Flange Size: 27 Breast pump type: Double-Electric Breast Pump Pump Education: Setup, frequency, and cleaning;Milk Storage Reason for Pumping: mom's choice to pump/bottle feed Pumping frequency: q 3hrs  Interventions Interventions: Breast feeding basics reviewed;Hand express;Coconut oil;DEBP;Education  Discharge Pump: DEBP WIC Program: Yes  Consult Status Consult Status: Follow-up Date: 02/10/21 Follow-up type: In-patient    Danford Bad 02/10/2021, 11:20 AM

## 2021-02-11 DIAGNOSIS — Z9189 Other specified personal risk factors, not elsewhere classified: Secondary | ICD-10-CM

## 2021-02-11 MED ORDER — ACETAMINOPHEN 325 MG PO TABS: 650.0000 mg | ORAL_TABLET | Freq: Four times a day (QID) | ORAL | Status: DC | PRN

## 2021-02-11 MED ORDER — IBUPROFEN 600 MG PO TABS: 600.0000 mg | ORAL_TABLET | Freq: Four times a day (QID) | ORAL | 0 refills | Status: DC

## 2021-02-11 NOTE — Discharge Instructions (Signed)
Postpartum Care After Vaginal Delivery The following information offers guidance about how to care for yourself from the time you deliver your baby to 6-12 weeks after delivery (postpartum period). If you have problems or questions, contact your health care provider for more specific instructions. Follow these instructions at home: Vaginal bleeding  It is normal to have vaginal bleeding (lochia) after delivery. Wear a sanitary pad for bleeding and discharge. ? During the first week after delivery, the amount and appearance of lochia is often similar to a menstrual period. ? Over the next few weeks, it will gradually decrease to a dry, yellow-brown discharge. ? For most women, lochia stops completely by 4-6 weeks after delivery, but can vary.  Change your sanitary pads frequently. Watch for any changes in your flow, such as: ? A sudden increase in volume. ? A change in color. ? Large blood clots.  If you pass a blood clot from your vagina, save it and call your health care provider. Do not flush blood clots down the toilet before talking with your health care provider.  Do not use tampons or douches until your health care provider approves.  If you are not breastfeeding, your period should return 6-8 weeks after delivery. If you are feeding your baby breast milk only, your period may not return until you stop breastfeeding. Perineal care  Keep the area between the vagina and the anus (perineum) clean and dry. Use medicated pads and pain-relieving sprays and creams as directed.  If you had a surgical cut in the perineum (episiotomy) or a tear, check the area for signs of infection until you are healed. Check for: ? More redness, swelling, or pain. ? Fluid or blood coming from the cut or tear. ? Warmth. ? Pus or a bad smell.  You may be given a squirt bottle to use instead of wiping to clean the perineum area after you use the bathroom. Pat the area gently to dry it.  To relieve pain  caused by an episiotomy, a tear, or swollen veins in the anus (hemorrhoids), take a warm sitz bath 2-3 times a day. In a sitz bath, the warm water should only come up to your hips and cover your buttocks.   Breast care  In the first few days after delivery, your breasts may feel heavy, full, and uncomfortable (breast engorgement). Milk may also leak from your breasts. Ask your health care provider about ways to help relieve the discomfort.  If you are breastfeeding: ? Wear a bra that supports your breasts and fits well. Use breast pads to absorb milk that leaks. ? Keep your nipples clean and dry. Apply creams and ointments as told. ? You may have uterine contractions every time you breastfeed for up to several weeks after delivery. This helps your uterus return to its normal size. ? If you have any problems with breastfeeding, notify your health care provider or lactation consultant.  If you are not breastfeeding: ? Avoid touching your breasts. Do not squeeze out (express) milk. Doing this can make your breasts produce more milk. ? Wear a good-fitting bra and use cold packs to help with swelling. Intimacy and sexuality  Ask your health care provider when you can engage in sexual activity. This may depend upon: ? Your risk of infection. ? How fast you are healing. ? Your comfort and desire to engage in sexual activity.  You are able to get pregnant after delivery, even if you have not had your period. Talk with   your health care provider about methods of birth control (contraception) or family planning if you desire future pregnancies. Medicines  Take over-the-counter and prescription medicines only as told by your health care provider.  Take an over-the-counter stool softener to help ease bowel movements as told by your health care provider.  If you were prescribed an antibiotic medicine, take it as told by your health care provider. Do not stop taking the antibiotic even if you start to  feel better.  Review all previous and current prescriptions to check for possible transfer into breast milk. Activity  Gradually return to your normal activities as told by your health care provider.  Rest as much as possible. Nap while your baby is sleeping. Eating and drinking  Drink enough fluid to keep your urine pale yellow.  To help prevent or relieve constipation, eat high-fiber foods every day.  Choose healthy eating to support breastfeeding or weight loss goals.  Take your prenatal vitamins until your health care provider tells you to stop.   General tips/recommendations  Do not use any products that contain nicotine or tobacco. These products include cigarettes, chewing tobacco, and vaping devices, such as e-cigarettes. If you need help quitting, ask your health care provider.  Do not drink alcohol, especially if you are breastfeeding.  Do not take medications or drugs that are not prescribed to you, especially if you are breastfeeding.  Visit your health care provider for a postpartum checkup within the first 3-6 weeks after delivery.  Complete a comprehensive postpartum visit no later than 12 weeks after delivery.  Keep all follow-up visits for you and your baby. Contact a health care provider if:  You feel unusually sad or worried.  Your breasts become red, painful, or hard.  You have a fever or other signs of an infection.  You have bleeding that is soaking through one pad an hour or you have blood clots.  You have a severe headache that doesn't go away or you have vision changes.  You have nausea and vomiting and are unable to eat or drink anything for 24 hours. Get help right away if:  You have chest pain or difficulty breathing.  You have sudden, severe leg pain.  You faint or have a seizure.  You have thoughts about hurting yourself or your baby. If you ever feel like you may hurt yourself or others, or have thoughts about taking your own life,  get help right away. Go to your nearest emergency department or:  Call your local emergency services (911 in the [place]  The National Suicide Prevention Lifeline at 1-800-273-8255. This suicide crisis helpline is open 24 hours a day.  Text the Crisis Text Line at 741741 (in the [place] Summary  The period of time after you deliver your newborn up to 6-12 weeks after delivery is called the postpartum period.  Keep all follow-up visits for you and your baby.  Review all previous and current prescriptions to check for possible transfer into breast milk.  Contact a health care provider if you feel unusually sad or worried during the postpartum period. This information is not intended to replace advice given to you by your health care provider. Make sure you discuss any questions you have with your health care provider. Document Revised: 08/27/2020 Document Reviewed: 08/27/2020 Elsevier Patient Education  2021 Elsevier Inc.   Postpartum Baby Blues The postpartum period begins right after the birth of a baby. During this time, there is often joy and excitement. It is   also a time of many changes in the life of the parents. A mother may feel happy one minute and sad or stressed the next. These feelings of sadness, called the baby blues, usually happen in the period right after the baby is born and go away within a week or two. What are the causes? The exact cause of this condition is not known. Changes in hormone levels after childbirth are believed to trigger some of the symptoms. Other factors that can play a role in these mood changes include:  Lack of sleep.  Stressful life events, such as financial problems, caring for a loved one, or death of a loved one.  Genetics. What are the signs or symptoms? Symptoms of this condition include:  Changes in mood, such as going from extreme happiness to sadness.  A decrease in concentration.  Difficulty sleeping.  Crying spells and  tearfulness.  Loss of appetite.  Irritability.  Anxiety. If these symptoms last for more than 2 weeks or become more severe, you may have postpartum depression. How is this diagnosed? This condition is diagnosed based on an evaluation of your symptoms. Your health care provider may use a screening tool that includes a list of questions to help identify a person with the baby blues or postpartum depression. How is this treated? The baby blues usually go away on their own in 1-2 weeks. Social support is often what is needed. You will be encouraged to get adequate sleep and rest. Follow these instructions at home: Lifestyle  Get as much rest as you can. Take a nap when the baby sleeps.  Exercise regularly as told by your health care provider. Some women find yoga and walking to be helpful.  Eat a balanced and nourishing diet. This includes plenty of fruits and vegetables, whole grains, and lean proteins.  Do little things that you enjoy. Take a bubble bath, read your favorite magazine, or listen to your favorite music.  Avoid alcohol.  Ask for help with household chores, cooking, grocery shopping, or running errands. Do not try to do everything yourself. Consider hiring a postpartum doula to help. This is a professional who specializes in providing support to new mothers.  Try not to make any major life changes during pregnancy or right after giving birth. This can add stress.      General instructions  Talk to people close to you about how you are feeling. Get support from your partner, family members, friends, or other new moms. You may want to join a support group.  Find ways to manage stress. This may include: ? Writing your thoughts and feelings in a journal. ? Spending time outside. ? Spending time with people who make you laugh.  Try to stay positive in how you think. Think about the things you are grateful for.  Take over-the-counter and prescription medicines only as  told by your health care provider.  Let your health care provider know if you have any concerns.  Keep all postpartum visits. This is important. Contact a health care provider if:  Your baby blues do not go away after 2 weeks. Get help right away if:  You have thoughts of taking your own life (suicidal thoughts), or of harming your baby or someone else.  You see or hear things that are not there (hallucinations). If you ever feel like you may hurt yourself or others, or have thoughts about taking your own life, get help right away. Go to your nearest emergency department or:    Call your local emergency services (911 in the [place]  Call a suicide crisis helpline, such as the National Suicide Prevention Lifeline, at 1-800-273-8255. This is open 24 hours a day in the [place]  Text the Crisis Text Line at 741741 (in the [place] Summary  After giving birth, you may feel happy one minute and sad or stressed the next. Feelings of sadness that happen right after the baby is born and go away after a week or two are called the baby blues.  You can manage the baby blues by getting enough rest, eating a healthy diet, exercising, spending time with supportive people, and finding ways to manage stress.  If feelings of sadness and stress last longer than 2 weeks or get in the way of caring for your baby, talk with your health care provider. This may mean you have postpartum depression. This information is not intended to replace advice given to you by your health care provider. Make sure you discuss any questions you have with your health care provider. Document Revised: 06/05/2020 Document Reviewed: 06/05/2020 Elsevier Patient Education  2021 Elsevier Inc.     

## 2021-02-11 NOTE — Progress Notes (Signed)
Pt discharged with infant. Discharge instructions, prescriptions, and follow up appointments given to and reviewed with patient. Pt verbalized understanding. To be escorted out by auxillary.  °
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
## 2021-02-11 NOTE — TOC Progression Note (Signed)
Transition of Care [facility]) - Progression Note    Patient Details  Name: Anne Rogers MRN: [id number] Date of Birth: [birth year]  Transition of Care [facility]) CM/SW Contact  [place] Cellar, RN Phone Number: 02/11/2021, 1:07 PM  Clinical Narrative:    Sherron Monday to [facility] [phone] with update on discharge disposition. Rose confirmed she would follow patient and infant for 6 weeks post partum.         Expected Discharge Plan and Services           Expected Discharge Date: 02/11/21                                     Social Determinants of Health (SDOH) Interventions    Readmission Risk Interventions No flowsheet data found.
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
## 2021-02-25 ENCOUNTER — Encounter: Payer: Self-pay | Admitting: Obstetrics & Gynecology

## 2021-02-25 ENCOUNTER — Ambulatory Visit (INDEPENDENT_AMBULATORY_CARE_PROVIDER_SITE_OTHER): Payer: Medicaid Other | Admitting: Obstetrics & Gynecology

## 2021-02-25 ENCOUNTER — Other Ambulatory Visit: Payer: Self-pay

## 2021-02-25 VITALS — BP 100/60 | Ht 63.0 in | Wt 260.0 lb

## 2021-02-25 DIAGNOSIS — F411 Generalized anxiety disorder: Secondary | ICD-10-CM

## 2021-02-25 DIAGNOSIS — F53 Postpartum depression: Secondary | ICD-10-CM | POA: Diagnosis not present

## 2021-02-25 DIAGNOSIS — F430 Acute stress reaction: Secondary | ICD-10-CM | POA: Insufficient documentation

## 2021-02-25 DIAGNOSIS — O99345 Other mental disorders complicating the puerperium: Secondary | ICD-10-CM | POA: Diagnosis not present

## 2021-02-25 MED ORDER — SERTRALINE HCL 100 MG PO TABS: 50.0000 mg | ORAL_TABLET | Freq: Every day | ORAL | 3 refills | Status: DC

## 2021-02-25 NOTE — Patient Instructions (Signed)
Referral List  Psychiatry    Counselors  Conni Elliot, MD   [facility]   [facility] [phone]    [phone]      [address] [place]  Jacquelin Hawking, MD   [facility] [address]  Licensed Professional Counselor, [place], [place]    [phone] [email or web address]       [employer]      Clinical Social Work/Therapist, MSW       [phone]    [facility] [phone]  [facility] ([facility] will see patients on a walk-in basis and then will schedule them depending on their needs. [phone] Drs Janeece Riggers and Percell Belt are psychiatrists who take Medicaid. They are located in [place], [phone] Kathreen Cosier is out of the [facility], is a LCSW who specializes in mental health. She sees children and adults. [phone].    Sertraline Solution What is this medicine? SERTRALINE (SER tra leen) is used to treat depression. It may also be used to treat obsessive compulsive disorder, panic disorder, post-trauma stress disorder, premenstrual dysphoric disorder (PMDD) or social anxiety. This medicine may be used for other purposes; ask your health care provider or pharmacist if you have questions. COMMON BRAND NAME(S): Zoloft What should I tell my health care provider before I take this medicine? They need to know if you have any of these conditions:  bleeding disorders  bipolar disorder or a family history of bipolar disorder  glaucoma  heart disease  high blood pressure  history of irregular heartbeat  history of low levels of calcium, magnesium, or potassium in the blood  if you often drink alcohol  liver disease  receiving electroconvulsive therapy  seizures  suicidal thoughts, plans, or attempt; a previous suicide attempt by you or a family member  take medicines that treat or prevent blood clots  thyroid disease  an  unusual or allergic reaction to sertraline, other medicines, foods, dyes, or preservatives  pregnant or trying to get pregnant  breast-feeding How should I use this medicine? Take this medicine by mouth. Follow the directions on the prescription label. Before taking your dose, you need to dilute the solution in a beverage. Measure your medicine dose using the dropper in the bottle. Next, place the measured dose in at least 4 ounces (one-half cup) of water, ginger-ale, lemon-lime soda, lemonade or orange juice and mix. Do not mix in grapefruit juice or in any other liquids other than those listed. Drink all of mixed liquid immediately. Do not mix the dose and store it for later. It must be taken right away. You may take this medicine with or without food. Take your medicine at regular intervals. Do not take your medicine more often than directed. Do not stop taking this medicine suddenly except upon the advice of your doctor. Stopping this medicine too quickly may cause serious side effects or your condition may worsen. A special MedGuide will be given to you by the pharmacist with each prescription and refill. Be sure to read this information carefully each time. Talk to your pediatrician regarding the use of this medicine in children. While this drug may be prescribed for children as young as 7 years for selected conditions, precautions do apply. Overdosage: If you think you have taken too much of this medicine contact a poison control center or emergency room at once. NOTE: This medicine is only for you. Do not  share this medicine with others. What if I miss a dose? If you miss a dose, take it as soon as you can. If it is almost time for your next dose, take only that dose. Do not take double or extra doses. What may interact with this medicine? Do not take this medicine with any of the following medications:  cisapride  dronedarone  linezolid  MAOIs like Carbex, Eldepryl, Marplan, Nardil,  and Parnate  methylene blue (injected into a vein)  pimozide  thioridazine This medicine may also interact with the following medications:  alcohol  amphetamines  aspirin and aspirin-like medicines  certain medicines for depression, anxiety, or psychotic disturbances  certain medicines for fungal infections like ketoconazole, fluconazole, posaconazole, and itraconazole  certain medicines for irregular heart beat like flecainide, quinidine, propafenone  certain medicines for migraine headaches like almotriptan, eletriptan, frovatriptan, naratriptan, rizatriptan, sumatriptan, zolmitriptan  certain medicines for sleep  certain medicines for seizures like carbamazepine, valproic acid, phenytoin  certain medicines that treat or prevent blood clots like warfarin, enoxaparin, dalteparin  cimetidine  digoxin  diuretics  fentanyl  isoniazid  lithium  NSAIDs, medicines for pain and inflammation, like ibuprofen or naproxen  other medicines that prolong the QT interval (cause an abnormal heart rhythm) like dofetilide  rasagiline  safinamide  supplements like St. John's wort, kava kava, valerian  tolbutamide  tramadol  tryptophan This list may not describe all possible interactions. Give your health care provider a list of all the medicines, herbs, non-prescription drugs, or dietary supplements you use. Also tell them if you smoke, drink alcohol, or use illegal drugs. Some items may interact with your medicine. What should I watch for while using this medicine? Tell your doctor if your symptoms do not get better or if they get worse. Visit your doctor or health care professional for regular checks on your progress. Because it may take several weeks to see the full effects of this medicine, it is important to continue your treatment as prescribed by your doctor. Patients and their families should watch out for new or worsening thoughts of suicide or depression. Also watch  out for sudden changes in feelings such as feeling anxious, agitated, panicky, irritable, hostile, aggressive, impulsive, severely restless, overly excited and hyperactive, or not being able to sleep. If this happens, especially at the beginning of treatment or after a change in dose, call your health care professional. Bonita Quin may get drowsy or dizzy. Do not drive, use machinery, or do anything that needs mental alertness until you know how this medicine affects you. Do not stand or sit up quickly, especially if you are an older patient. This reduces the risk of dizzy or fainting spells. Alcohol may interfere with the effect of this medicine. Avoid alcoholic drinks. This medicine contains a high percentage of alcohol that may interact with medicines used to treat alcohol abuse, like Antabuse (disulfiram). You should not take these medicines together. Your mouth may get dry. Chewing sugarless gum or sucking hard candy, and drinking plenty of water may help. Contact your doctor if the problem does not go away or is severe. Some products may contain alcohol. Ask your pharmacist or healthcare provider if this medicine contains alcohol. Be sure to tell all healthcare providers you are taking this medicine. Certain medicines, like metronidazole and disulfiram, can cause an unpleasant reaction when taken with alcohol. The reaction includes flushing, headache, nausea, vomiting, sweating, and increased thirst. The reaction can last from 30 minutes to several hours. What side effects  may I notice from receiving this medicine? Side effects that you should report to your doctor or health care professional as soon as possible:  allergic reactions like skin rash, itching or hives, swelling of the face, lips, or tongue  anxious  black, tarry stools  changes in vision  confusion  elevated mood, decreased need for sleep, racing thoughts, impulsive behavior  eye pain  fast, irregular heartbeat  feeling faint or  lightheaded, falls  feeling agitated, angry, or irritable  hallucination, loss of contact with reality  loss of balance or coordination  loss of memory  painful or prolonged erections  restlessness, pacing, inability to keep still  seizures  stiff muscles  suicidal thoughts or other mood changes  trouble sleeping  unusual bleeding or bruising  unusually weak or tired  vomiting Side effects that usually do not require medical attention (report to your doctor or health care professional if they continue or are bothersome):  change in appetite or weight  change in sex drive or performance  diarrhea  increased sweating  indigestion, nausea  tremors This list may not describe all possible side effects. Call your doctor for medical advice about side effects. You may report side effects to FDA at 1-800-FDA-1088. Where should I keep my medicine? Keep out of the reach of children. Store at room temperature between 15 and 30 degrees C (59 and 86 degrees F). Throw away any unused medicine after the expiration date. NOTE: This sheet is a summary. It may not cover all possible information. If you have questions about this medicine, talk to your doctor, pharmacist, or health care provider.  2021 Elsevier/Gold Standard (2020-10-21 19:04:00)

## 2021-02-25 NOTE — Progress Notes (Signed)
Subjective:     Anne Rogers is a 25 y.o. female [id number] 2 weeks s/p NSVD who presents for concerns with depression.  She has very stressful relationship with current BF/FOB and there is a lot of fighting and threatening legal behaviors.  This causes her to cry, be anxious, and become upset.  She does not feel homicidal nor suicidal, and cares well for her two girls. Current symptoms include depressed mood, difficulty concentrating, fatigue, feelings of worthlessness/guilt, hopelessness and insomnia. Symptoms have been unchanged since that time. Patient denies recurrent thoughts of death, suicidal attempt, suicidal thoughts with specific plan and suicidal thoughts without plan. Previous treatment includes: none.  The following portions of the patient's history were reviewed and updated as appropriate: allergies, current medications, past family history, past medical history, past social history, past surgical history and problem list.  Review of Systems Pertinent items are noted in HPI.    Objective:    BP 100/60   Ht 5\' 3"  (1.6 m)   Wt 260 lb (117.9 kg)   BMI 46.06 kg/m   General:  alert, cooperative and no distress  Affect & Behavior:  full facial expressions, good grooming, good insight, normal perception, normal reasoning, normal speech pattern and content and normal thought patterns       Assessment:     ICD-10-CM   1. Anxiety in acute stress reaction  F41.1    F43.0   2. Depression, postpartum  O99.345 sertraline (ZOLOFT) 100 MG tablet   F53.0    Plan:    Medications: Zoloft.  50 mg daily. Pros and cons and side effects discussed. Recommended counseling. List of counselors provided. Handouts describing disease, natural history, and treatment were given to the patient. Reviewed concept of anxiety as biochemical imbalance of neurotransmitters and rationale for treatment. Instructed patient to contact office or on-call physician promptly should condition worsen or any new  symptoms appear and provided on-call telephone numbers. IF THE PATIENT HAS ANY SUICIDAL OR HOMICIDAL IDEATIONS, CALL THE OFFICE, DISCUSS WITH A SUPPORT MEMBER, OR GO TO THE ER IMMEDIATELY. Patient was agreeable with this plan. Follow up: 2 weeks.    , MD, Annamarie Major Ob/Gyn, [facility] 02/25/2021  3:19 PM

## 2021-03-11 ENCOUNTER — Encounter: Payer: Self-pay | Admitting: Obstetrics and Gynecology

## 2021-03-11 ENCOUNTER — Ambulatory Visit (INDEPENDENT_AMBULATORY_CARE_PROVIDER_SITE_OTHER): Payer: Medicaid Other | Admitting: Obstetrics and Gynecology

## 2021-03-11 ENCOUNTER — Other Ambulatory Visit: Payer: Self-pay

## 2021-03-11 VITALS — Ht 63.0 in | Wt 260.0 lb

## 2021-03-11 DIAGNOSIS — F53 Postpartum depression: Secondary | ICD-10-CM

## 2021-03-11 DIAGNOSIS — O99345 Other mental disorders complicating the puerperium: Secondary | ICD-10-CM

## 2021-03-11 NOTE — Progress Notes (Signed)
    Routine Prenatal Care Visit- Virtual Visit  Subjective   Virtual Visit via Telephone Note  I connected with Anne Rogers on 03/11/21 at 11:10 AM EDT by telephone and verified that I am speaking with the correct person using two identifiers.   I discussed the limitations, risks, security and privacy concerns of performing an evaluation and management service by telephone and the availability of in person appointments. I also discussed with the patient that there may be a patient responsible charge related to this service. The patient expressed understanding and agreed to proceed.  The patient was at home I spoke with the patient from my  office The names of people involved in this encounter were: Dr. Jerene Pitch and [facility].   History of Present Illness: She was seen two weeks ago and diagnosed with postpartum depression. She reports she had a difficult delivery and has been having difficulty with men in her life who may or not be the father of the baby and contact social services regarding the patient. She reports she has been lining up a therapist. She has not started zoloft. Spending time with her children helps her feel better. Her 69 year old is also being evaluated for ADHD and autism. She reports the 27 year old is working on adapting to     Observations/Objective:  Physical Exam could not be performed. Because of the COVID-19 outbreak this visit was performed over the phone and not in person.   Assessment and Plan: 25 yo with postpartum depression Patient has follow up planned with social work and is working to connect with therapist She has not yet started zoloft but will consider.   Follow Up Instructions: Follow up in 2 weeks with Dr. Tiburcio Pea or Claris Che who are familiar with the patient.    I discussed the assessment and treatment plan with the patient. The patient was provided an opportunity to ask questions and all were answered. The patient agreed with the plan and  demonstrated an understanding of the instructions.   The patient was advised to call back or seek an in-person evaluation if the symptoms worsen or if the condition fails to improve as anticipated.  I provided 10 minutes of non-face-to-face time during this encounter.  Adelene Idler MD [facility], [facility] 03/11/2021 12:09 PM

## 2021-03-22 ENCOUNTER — Ambulatory Visit: Payer: Medicaid Other | Admitting: Obstetrics & Gynecology

## 2021-04-17 DIAGNOSIS — N6310 Unspecified lump in the right breast, unspecified quadrant: Secondary | ICD-10-CM | POA: Insufficient documentation

## 2021-04-17 DIAGNOSIS — Z5321 Procedure and treatment not carried out due to patient leaving prior to being seen by health care provider: Secondary | ICD-10-CM | POA: Insufficient documentation

## 2021-04-18 ENCOUNTER — Emergency Department
Admission: EM | Admit: 2021-04-18 | Discharge: 2021-04-18 | Disposition: A | Discharge status: Home / Self Care | Payer: Medicaid Other | Attending: Emergency Medicine | Admitting: Emergency Medicine

## 2021-04-18 ENCOUNTER — Encounter: Payer: Self-pay | Admitting: Emergency Medicine

## 2021-04-18 NOTE — ED Notes (Signed)
Tried to call patient's phone, not visualized in lobby or outside.

## 2021-04-18 NOTE — ED Triage Notes (Signed)
Pt postpartum 2 months, breast feeding and pumping. Pt noted lump to the right nipple 3 days ago, pumped thinking it was a clogged duct but in last 24 hours area has pumped and blood and yellow/white discharge drained. No fevers noted. Swelling and redness have gone down. Pt concerned due to pumping.

## 2021-04-18 NOTE — ED Notes (Signed)
Pt was observed walking out of lobby doors, attempted to call patient to inform her room was ready, no answer.

## 2021-04-19 ENCOUNTER — Encounter: Payer: Self-pay | Admitting: Obstetrics

## 2021-04-19 ENCOUNTER — Telehealth: Payer: Self-pay | Admitting: Emergency Medicine

## 2021-04-19 ENCOUNTER — Other Ambulatory Visit: Payer: Self-pay

## 2021-04-19 ENCOUNTER — Ambulatory Visit (INDEPENDENT_AMBULATORY_CARE_PROVIDER_SITE_OTHER): Payer: Medicaid Other | Admitting: Obstetrics

## 2021-04-19 VITALS — BP 110/70 | Ht 63.0 in | Wt 258.0 lb

## 2021-04-19 DIAGNOSIS — N649 Disorder of breast, unspecified: Secondary | ICD-10-CM | POA: Diagnosis not present

## 2021-04-19 NOTE — Telephone Encounter (Signed)
Called patient due to left emergency department before provider exam to inquire about condition and follow up plans. She says she is about to contact her doctor right now.

## 2021-04-19 NOTE — Progress Notes (Signed)
Obstetrics & Gynecology Office Visit   Chief Complaint:  Chief Complaint  Patient presents with  . Breast Exam    Lump on RB    History of Present Illness: Anne Rogers presents for follow up of a breast /lactation problem she discovered several days ago. She had a smal bump on her nipple that she noticed when pumping milk for her baby. The "bleb" was then opened up by the patient, who used a needle. Anne Rogers shares that tow days later, she then noticed some whit e and bloody sidcharge coming from the bleb. This stopped yesterday. She jujst wants it evaluated.   Review of Systems:  Review of Systems  Constitutional: Negative.   HENT: Negative.   Eyes: Negative.   Gastrointestinal: Negative.   Genitourinary: Negative.   Skin:       Nipple bump at 2 PM on her right nipple  Neurological: Negative.   Endo/Heme/Allergies: Negative.   Psychiatric/Behavioral:       Denies depression today, however has been seen for situational mood issues Postpartally.     Past Medical History:  Past Medical History:  Diagnosis Date  . Anxiety   . Depression   . Gall stones     Past Surgical History:  Past Surgical History:  Procedure Laterality Date  . ABDOMINAL SURGERY    . INDUCED ABORTION  02/2015  . INDUCED ABORTION     x 2  . INDUCED ABORTION     x 3    Gynecologic History: No LMP recorded.  Obstetric History: [id number]  Family History:  Family History  Problem Relation Age of Onset  . Cancer Mother   . Cancer Father     Social History:  Social History   Socioeconomic History  . Marital status: Media planner    Spouse name: Anne Rogers  . Number of children: 1  . Years of education: Not on file  . Highest education level: Not on file  Occupational History  . Occupation: Unemployed  Tobacco Use  . Smoking status: Current Some Day Smoker    Packs/day: 0.25    Years: 1.50    Pack years: 0.37    Types: Cigarettes  . Smokeless tobacco: Never Used  Vaping Use  .  Vaping Use: Every day  Substance and Sexual Activity  . Alcohol use: Not Currently  . Drug use: No  . Sexual activity: Yes    Birth control/protection: None, Condom  Other Topics Concern  . Not on file  Social History Narrative   Does not want Anne Rogers Here for delivery   Social Determinants of Health   Financial Resource Strain: Not on file  Food Insecurity: Not on file  Transportation Needs: Not on file  Physical Activity: Not on file  Stress: Not on file  Social Connections: Not on file  Intimate Partner Violence: Not on file    Allergies:  No Known Allergies  Medications: Prior to Admission medications   Medication Sig Start Date End Date Taking? Authorizing Provider  acetaminophen (TYLENOL) 325 MG tablet Take 2 tablets (650 mg total) by mouth every 6 (six) hours as needed (for pain scale < 4). 02/11/21  Yes Veal, Katelyn, CNM  esomeprazole (NEXIUM) 20 MG capsule Take 1 capsule (20 mg total) by mouth daily at 12 noon. 09/18/20  Yes Nadara Mustard, MD  ibuprofen (ADVIL) 600 MG tablet Take 1 tablet (600 mg total) by mouth every 6 (six) hours. 02/11/21  Yes [name], CNM  Prenatal Vit-Fe Fumarate-FA (MULTIVITAMIN-PRENATAL) 27-0.8  MG TABS tablet Take 1 tablet by mouth daily at 12 noon.   Yes [provider]  sertraline (ZOLOFT) 100 MG tablet Take 0.5 tablets (50 mg total) by mouth daily. 02/25/21  Yes Nadara Mustard, MD    Physical Exam Vitals:  Vitals:   04/19/21 1155  BP: 110/70   No LMP recorded.  Physical Exam Vitals reviewed.  Constitutional:      Appearance: Normal appearance. She is obese.  HENT:     Head: Normocephalic and atraumatic.  Cardiovascular:     Rate and Rhythm: Normal rate and regular rhythm.  Pulmonary:     Effort: Pulmonary effort is normal.     Breath sounds: Normal breath sounds.  Abdominal:     Palpations: Abdomen is soft.  Genitourinary:    Comments: deferred Musculoskeletal:        General: Normal range of motion.      Cervical back: Normal range of motion and neck supple.  Skin:    General: Skin is warm and dry.     Comments: Breast exam: breasts are pendulous; nipples slightly everted. No nipple trauma , stripes or lacerations noted today. No "bleb" seen  On exam today. Breasts are not engorged. No redness noted.  Right nipple bleb appears completely healed.  Neurological:     Mental Status: She is alert and oriented to person, place, and time.  Psychiatric:        Mood and Affect: Mood normal.        Behavior: Behavior normal.      Assessment: 25 y.o. [id number] with concern regarding right nipple bleb   Plan: Problem List Items Addressed This Visit   None    Reassurance provided that she has effectively treated  Her nipple bleb. Encouraged to continue giving her baby breast milk. Has a Postpartum exam next week with Anne Rogers. Contraception discussed briefly today- likely OCPs.  Strongly encouraged her to continue working with [employer] team of social workers.  Anne Rogers, CNM  04/19/2021 12:47 PM

## 2021-04-26 ENCOUNTER — Other Ambulatory Visit: Payer: Self-pay

## 2021-04-26 ENCOUNTER — Encounter: Payer: Self-pay | Admitting: Obstetrics & Gynecology

## 2021-04-26 ENCOUNTER — Ambulatory Visit (INDEPENDENT_AMBULATORY_CARE_PROVIDER_SITE_OTHER): Payer: Medicaid Other | Admitting: Obstetrics & Gynecology

## 2021-04-26 DIAGNOSIS — F53 Postpartum depression: Secondary | ICD-10-CM

## 2021-04-26 DIAGNOSIS — O99345 Other mental disorders complicating the puerperium: Secondary | ICD-10-CM

## 2021-04-26 MED ORDER — NORETHINDRONE 0.35 MG PO TABS: 1.0000 | ORAL_TABLET | Freq: Every day | ORAL | 11 refills | Status: DC

## 2021-04-26 MED ORDER — SERTRALINE HCL 100 MG PO TABS: 50.0000 mg | ORAL_TABLET | Freq: Every day | ORAL | 5 refills | Status: DC

## 2021-04-26 NOTE — Patient Instructions (Signed)
Norethindrone tablets (contraception) What is this medicine? NORETHINDRONE (nor eth IN drone) is an oral contraceptive. The product contains a female hormone known as a progestin. It is used to prevent pregnancy. This medicine may be used for other purposes; ask your health care provider or pharmacist if you have questions. COMMON BRAND NAME(S): Camila, Deblitane 28-Day, Errin, Heather, Jencycla, Jolivette, Lyza, Nor-QD, Nora-BE, Norlyroc, Ortho Micronor, Sharobel 28-Day What should I tell my health care provider before I take this medicine? They need to know if you have any of these conditions:  blood vessel disease or blood clots  breast, cervical, or vaginal cancer  diabetes  heart disease  kidney disease  liver disease  mental depression  migraine  seizures  stroke  vaginal bleeding  an unusual or allergic reaction to norethindrone, other medicines, foods, dyes, or preservatives  pregnant or trying to get pregnant  breast-feeding How should I use this medicine? Take this medicine by mouth with a glass of water. You may take it with or without food. Follow the directions on the prescription label. Take this medicine at the same time each day and in the order directed on the package. Do not take your medicine more often than directed. Contact your pediatrician regarding the use of this medicine in children. Special care may be needed. This medicine has been used in female children who have started having menstrual periods. A patient package insert for the product will be given with each prescription and refill. Read this sheet carefully each time. The sheet may change frequently. Overdosage: If you think you have taken too much of this medicine contact a poison control center or emergency room at once. NOTE: This medicine is only for you. Do not share this medicine with others. What if I miss a dose? Try not to miss a dose. Every time you miss a dose or take a dose late  your chance of pregnancy increases. When 1 pill is missed (even if only 3 hours late), take the missed pill as soon as possible and continue taking a pill each day at the regular time (use a back up method of birth control for the next 48 hours). If more than 1 dose is missed, use an additional birth control method for the rest of your pill pack until menses occurs. Contact your health care professional if more than 1 dose has been missed. What may interact with this medicine? Do not take this medicine with any of the following medications:  amprenavir or fosamprenavir  bosentan This medicine may also interact with the following medications:  antibiotics or medicines for infections, especially rifampin, rifabutin, rifapentine, and griseofulvin, and possibly penicillins or tetracyclines  aprepitant  barbiturate medicines, such as phenobarbital  carbamazepine  felbamate  modafinil  oxcarbazepine  phenytoin  ritonavir or other medicines for HIV infection or AIDS  St. John's wort  topiramate This list may not describe all possible interactions. Give your health care provider a list of all the medicines, herbs, non-prescription drugs, or dietary supplements you use. Also tell them if you smoke, drink alcohol, or use illegal drugs. Some items may interact with your medicine. What should I watch for while using this medicine? Visit your doctor or health care professional for regular checks on your progress. You will need a regular breast and pelvic exam and Pap smear while on this medicine. Use an additional method of birth control during the first cycle that you take these tablets. If you have any reason to think you   are pregnant, stop taking this medicine right away and contact your doctor or health care professional. If you are taking this medicine for hormone related problems, it may take several cycles of use to see improvement in your condition. This medicine does not protect you  against HIV infection (AIDS) or any other sexually transmitted diseases. What side effects may I notice from receiving this medicine? Side effects that you should report to your doctor or health care professional as soon as possible:  breast tenderness or discharge  pain in the abdomen, chest, groin or leg  severe headache  skin rash, itching, or hives  sudden shortness of breath  unusually weak or tired  vision or speech problems  yellowing of skin or eyes Side effects that usually do not require medical attention (report to your doctor or health care professional if they continue or are bothersome):  changes in sexual desire  change in menstrual flow  facial hair growth  fluid retention and swelling  headache  irritability  nausea  weight gain or loss This list may not describe all possible side effects. Call your doctor for medical advice about side effects. You may report side effects to FDA at 1-800-FDA-1088. Where should I keep my medicine? Keep out of the reach of children. Store at room temperature between 15 and 30 degrees C (59 and 86 degrees F). Throw away any unused medicine after the expiration date. NOTE: This sheet is a summary. It may not cover all possible information. If you have questions about this medicine, talk to your doctor, pharmacist, or health care provider.  2021 Elsevier/Gold Standard (2012-08-31 16:41:35)  

## 2021-04-26 NOTE — Progress Notes (Signed)
  OBSTETRICS POSTPARTUM CLINIC PROGRESS NOTE  Subjective:     Anne Rogers is a 25 y.o. [phone] female who presents for a postpartum visit. She is 6 weeks postpartum following a Term pregnancy or Uncomplicated pregnancy and delivery by Vaginal, no problems at delivery.  I have fully reviewed the prenatal and intrapartum course. Anesthesia: epidural.  Postpartum course has been complicated by uncomplicated.  Baby is feeding by Breast.  Bleeding: patient has not  resumed menses.  Bowel function is normal. Bladder function is normal.  Patient is not sexually active. Contraception method desired is oral progesterone-only contraceptive.  Postpartum depression screening: positive. [place] 11.  She was started on Zoloft a few weeks ago and feels this has helped. The following portions of the patient's history were reviewed and updated as appropriate: allergies, current medications, past family history, past medical history, past social history, past surgical history and problem list.  Review of Systems Pertinent items are noted in HPI.  Objective:    BP 120/80   Ht 5\' 3"  (1.6 m)   Wt 211 lb (95.7 kg)   BMI 37.38 kg/m   General:  alert and no distress   Breasts:  inspection negative, no nipple discharge or bleeding, no masses or nodularity palpable  Lungs: clear to auscultation bilaterally  Heart:  regular rate and rhythm, S1, S2 normal, no murmur, click, rub or gallop  Abdomen: soft, non-tender; bowel sounds normal; no masses,  no organomegaly.     Vulva:  normal  Vagina: normal vagina, no discharge, exudate, lesion, or erythema  Cervix:  no cervical motion tenderness and no lesions  Corpus: normal size, contour, position, consistency, mobility, non-tender  Adnexa:  normal adnexa and no mass, fullness, tenderness  Rectal Exam: Not performed.          Assessment:  Post Partum Care visit 1. Postpartum care and examination  2. Postpartum depression Cont Zoloft Consider  counseling - sertraline (ZOLOFT) 100 MG tablet; Take 0.5 tablets (50 mg total) by mouth daily.  Dispense: 30 tablet; Refill: 5   Plan:  See orders and Patient Instructions Contraceptive counseling for oral progesterone-only contraceptive    Rx Micronor    Change to OCP when stops breast feeding    Considering BTL in future per her choice Follow up in: 2 months or as needed.   , MD, Annamarie Major Ob/Gyn, [facility] 04/26/2021  11:23 AM

## 2021-07-02 ENCOUNTER — Ambulatory Visit (INDEPENDENT_AMBULATORY_CARE_PROVIDER_SITE_OTHER): Payer: Medicaid Other | Admitting: Obstetrics & Gynecology

## 2021-07-02 ENCOUNTER — Encounter: Payer: Self-pay | Admitting: Obstetrics & Gynecology

## 2021-07-02 ENCOUNTER — Other Ambulatory Visit (HOSPITAL_COMMUNITY)
Admission: RE | Admit: 2021-07-02 | Discharge: 2021-07-02 | Disposition: A | Discharge status: Home / Self Care | Payer: Medicaid Other | Source: Ambulatory Visit | Attending: Obstetrics & Gynecology | Admitting: Obstetrics & Gynecology

## 2021-07-02 ENCOUNTER — Other Ambulatory Visit: Payer: Self-pay

## 2021-07-02 VITALS — BP 120/80 | Ht 63.0 in | Wt 271.0 lb

## 2021-07-02 DIAGNOSIS — Z124 Encounter for screening for malignant neoplasm of cervix: Secondary | ICD-10-CM | POA: Diagnosis not present

## 2021-07-02 DIAGNOSIS — Z01419 Encounter for gynecological examination (general) (routine) without abnormal findings: Secondary | ICD-10-CM

## 2021-07-02 DIAGNOSIS — O99345 Other mental disorders complicating the puerperium: Secondary | ICD-10-CM | POA: Diagnosis not present

## 2021-07-02 DIAGNOSIS — F53 Postpartum depression: Secondary | ICD-10-CM

## 2021-07-02 MED ORDER — SERTRALINE HCL 100 MG PO TABS: 100.0000 mg | ORAL_TABLET | Freq: Every day | ORAL | 2 refills | Status: AC

## 2021-07-02 NOTE — Patient Instructions (Signed)
Thank you for choosing [facility]. As part of our ongoing efforts to improve patient experience, we would appreciate your feedback. Please fill out the short survey that you will receive by mail or MyChart. Your opinion is important to [place]! -Dr Tiburcio Pea  Health Maintenance, Female Adopting a healthy lifestyle and getting preventive care are important in promoting health and wellness. Ask your health care provider about: The right schedule for you to have regular tests and exams. Things you can do on your own to prevent diseases and keep yourself healthy. What should I know about diet, weight, and exercise? Eat a healthy diet  Eat a diet that includes plenty of vegetables, fruits, low-fat dairy products, and lean protein. Do not eat a lot of foods that are high in solid fats, added sugars, or sodium.  Maintain a healthy weight Body mass index (BMI) is used to identify weight problems. It estimates body fat based on height and weight. Your health care provider can help determineyour BMI and help you achieve or maintain a healthy weight. Get regular exercise Get regular exercise. This is one of the most important things you can do for your health. Most adults should: Exercise for at least 150 minutes each week. The exercise should increase your heart rate and make you sweat (moderate-intensity exercise). Do strengthening exercises at least twice a week. This is in addition to the moderate-intensity exercise. Spend less time sitting. Even light physical activity can be beneficial. Watch cholesterol and blood lipids Have your blood tested for lipids and cholesterol at 27 years of age, then havethis test every 5 years. Have your cholesterol levels checked more often if: Your lipid or cholesterol levels are high. You are older than 27 years of age. You are at high risk for heart disease. What should I know about cancer screening? Depending on your health history and family history, you may need  to have cancer screening at various ages. This may include screening for: Breast cancer. Cervical cancer. Colorectal cancer. Skin cancer. Lung cancer. What should I know about heart disease, diabetes, and high blood pressure? Blood pressure and heart disease High blood pressure causes heart disease and increases the risk of stroke. This is more likely to develop in people who have high blood pressure readings, are of African descent, or are overweight. Have your blood pressure checked: Every 3-5 years if you are 38-29 years of age. Every year if you are 67 years old or older. Diabetes Have regular diabetes screenings. This checks your fasting blood sugar level. Have the screening done: Once every three years after age 41 if you are at a normal weight and have a low risk for diabetes. More often and at a younger age if you are overweight or have a high risk for diabetes. What should I know about preventing infection? Hepatitis B If you have a higher risk for hepatitis B, you should be screened for this virus. Talk with your health care provider to find out if you are at risk forhepatitis B infection. Hepatitis C Testing is recommended for: Everyone born from [birth year] through 1967. Anyone with known risk factors for hepatitis C. Sexually transmitted infections (STIs) Get screened for STIs, including gonorrhea and chlamydia, if: You are sexually active and are younger than 27 years of age. You are older than 27 years of age and your health care provider tells you that you are at risk for this type of infection. Your sexual activity has changed since you were last screened, and  you are at increased risk for chlamydia or gonorrhea. Ask your health care provider if you are at risk. Ask your health care provider about whether you are at high risk for HIV. Your health care provider may recommend a prescription medicine to help prevent HIV infection. If you choose to take medicine to prevent HIV, you  should first get tested for HIV. You should then be tested every 3 months for as long as you are taking the medicine. Pregnancy If you are about to stop having your period (premenopausal) and you may become pregnant, seek counseling before you get pregnant. Take 400 to 800 micrograms (mcg) of folic acid every day if you become pregnant. Ask for birth control (contraception) if you want to prevent pregnancy. Osteoporosis and menopause Osteoporosis is a disease in which the bones lose minerals and strength with aging. This can result in bone fractures. If you are 65 years old or older, or if you are at risk for osteoporosis and fractures, ask your health care provider if you should: Be screened for bone loss. Take a calcium or vitamin D supplement to lower your risk of fractures. Be given hormone replacement therapy (HRT) to treat symptoms of menopause. Follow these instructions at home: Lifestyle Do not use any products that contain nicotine or tobacco, such as cigarettes, e-cigarettes, and chewing tobacco. If you need help quitting, ask your health care provider. Do not use street drugs. Do not share needles. Ask your health care provider for help if you need support or information about quitting drugs. Alcohol use Do not drink alcohol if: Your health care provider tells you not to drink. You are pregnant, may be pregnant, or are planning to become pregnant. If you drink alcohol: Limit how much you use to 0-1 drink a day. Limit intake if you are breastfeeding. Be aware of how much alcohol is in your drink. In the [place] one drink equals one 12 oz bottle of beer (355 mL), one 5 oz glass of wine (148 mL), or one 1 oz glass of hard liquor (44 mL). General instructions Schedule regular health, dental, and eye exams. Stay current with your vaccines. Tell your health care provider if: You often feel depressed. You have ever been abused or do not feel safe at home. Summary Adopting a healthy  lifestyle and getting preventive care are important in promoting health and wellness. Follow your health care provider's instructions about healthy diet, exercising, and getting tested or screened for diseases. Follow your health care provider's instructions on monitoring your cholesterol and blood pressure. This information is not intended to replace advice given to you by your health care provider. Make sure you discuss any questions you have with your healthcare provider. Document Revised: 12/05/2018 Document Reviewed: 12/05/2018 Elsevier Patient Education  2022 Elsevier Inc.  

## 2021-07-02 NOTE — Progress Notes (Signed)
HPI:      [name] is a 25 y.o. [phone] who LMP was No LMP recorded., she presents today for her annual examination. The patient has no complaints today. The patient is sexually active. Her last pap: was normal. The patient does perform self breast exams.  There is no notable family history of breast or ovarian cancer in her family.  The patient has regular exercise: yes.  The patient denies current symptoms of depression.    GYN History: Contraception: oral progesterone-only contraceptive  PMHx: Past Medical History:  Diagnosis Date   Anxiety    Depression    Gall stones    Past Surgical History:  Procedure Laterality Date   ABDOMINAL SURGERY     INDUCED ABORTION  02/2015   INDUCED ABORTION     x 2   INDUCED ABORTION     x 3   Family History  Problem Relation Age of Onset   Cancer Mother    Cancer Father    Social History   Tobacco Use   Smoking status: Some Days    Packs/day: 0.25    Years: 1.50    Pack years: 0.38    Types: Cigarettes   Smokeless tobacco: Never  Vaping Use   Vaping Use: Every day  Substance Use Topics   Alcohol use: Not Currently   Drug use: No    Current Outpatient Medications:    norethindrone (MICRONOR) 0.35 MG tablet, Take 1 tablet (0.35 mg total) by mouth daily., Disp: 28 tablet, Rfl: 11   sertraline (ZOLOFT) 100 MG tablet, Take 1 tablet (100 mg total) by mouth daily., Disp: 90 tablet, Rfl: 2 Allergies: Patient has no known allergies.  Review of Systems  Constitutional:  Negative for chills, fever and malaise/fatigue.  HENT:  Negative for congestion, sinus pain and sore throat.   Eyes:  Negative for blurred vision and pain.  Respiratory:  Negative for cough and wheezing.   Cardiovascular:  Negative for chest pain and leg swelling.  Gastrointestinal:  Negative for abdominal pain, constipation, diarrhea, heartburn, nausea and vomiting.  Genitourinary:  Negative for dysuria, frequency, hematuria and urgency.   Musculoskeletal:  Negative for back pain, joint pain, myalgias and neck pain.  Skin:  Negative for itching and rash.  Neurological:  Negative for dizziness, tremors and weakness.  Endo/Heme/Allergies:  Does not bruise/bleed easily.  Psychiatric/Behavioral:  Negative for depression. The patient is not nervous/anxious and does not have insomnia.    Objective: BP 120/80   Ht 5\' 3"  (1.6 m)   Wt 271 lb (122.9 kg)   BMI 48.01 kg/m   Filed Weights   07/02/21 1011  Weight: 271 lb (122.9 kg)   Body mass index is 48.01 kg/m. Physical Exam Constitutional:      General: She is not in acute distress.    Appearance: She is well-developed.  Genitourinary:     Bladder, rectum and urethral meatus normal.     No lesions in the vagina.     Right Labia: No rash, tenderness or lesions.    Left Labia: No tenderness, lesions or rash.    No vaginal bleeding.      Right Adnexa: not tender and no mass present.    Left Adnexa: not tender and no mass present.    No cervical motion tenderness, friability, lesion or polyp.     Uterus is not enlarged.     No uterine mass detected.    Pelvic exam was performed with patient in  the lithotomy position.  Breasts:    Right: No mass, skin change or tenderness.     Left: No mass, skin change or tenderness.  HENT:     Head: Normocephalic and atraumatic. No laceration.     Right Ear: Hearing normal.     Left Ear: Hearing normal.     Mouth/Throat:     Pharynx: Uvula midline.  Eyes:     Pupils: Pupils are equal, round, and reactive to light.  Neck:     Thyroid: No thyromegaly.  Cardiovascular:     Rate and Rhythm: Normal rate and regular rhythm.     Heart sounds: No murmur heard.   No friction rub. No gallop.  Pulmonary:     Effort: Pulmonary effort is normal. No respiratory distress.     Breath sounds: Normal breath sounds. No wheezing.  Abdominal:     General: Bowel sounds are normal. There is no distension.     Palpations: Abdomen is soft.      Tenderness: There is no abdominal tenderness. There is no rebound.  Musculoskeletal:        General: Normal range of motion.     Cervical back: Normal range of motion and neck supple.  Neurological:     Mental Status: She is alert and oriented to person, place, and time.     Cranial Nerves: No cranial nerve deficit.  Skin:    General: Skin is warm and dry.  Psychiatric:        Judgment: Judgment normal.  Vitals reviewed.    Assessment:  ANNUAL EXAM 1. Women's annual routine gynecological examination   2. Depression, postpartum   3. Screening for cervical cancer      Screening Plan:            1.  Cervical Screening-  Pap smear done today  2. Breast screening- Exam annually and mammogram>40 planned   3. Depression, postpartum - adjust med dose, consider alternatives - sertraline (ZOLOFT) 100 MG tablet; Take 1 tablet (100 mg total) by mouth daily.  Dispense: 90 tablet; Refill: 2 - counseling discussed as well a soption  4. Labs managed by PCP  5. Counseling for contraception: oral progesterone-only contraceptive  The pregnancy intention screening data noted above was reviewed. Potential methods of contraception were discussed. The patient elected to proceed with Oral Contraceptive.       F/U  Return in about 2 months (around 09/02/2021) for Follow up Virtual.  Annamarie Major, MD, [facility], [facility] 07/02/2021  10:51 AM

## 2021-07-08 LAB — CYTOLOGY - PAP: Diagnosis: NEGATIVE

## 2021-09-07 NOTE — Telephone Encounter (Signed)
You make those recommendations.  If worsens, she should be seen (prob by PCP)

## 2022-08-08 ENCOUNTER — Emergency Department
Admission: EM | Admit: 2022-08-08 | Discharge: 2022-08-08 | Disposition: A | Discharge status: Home / Self Care | Payer: Medicaid Other | Attending: Emergency Medicine | Admitting: Emergency Medicine

## 2022-08-08 ENCOUNTER — Other Ambulatory Visit: Payer: Self-pay

## 2022-08-08 ENCOUNTER — Emergency Department: Payer: Medicaid Other

## 2022-08-08 DIAGNOSIS — G2581 Restless legs syndrome: Secondary | ICD-10-CM | POA: Diagnosis not present

## 2022-08-08 DIAGNOSIS — R110 Nausea: Secondary | ICD-10-CM | POA: Diagnosis not present

## 2022-08-08 DIAGNOSIS — M7918 Myalgia, other site: Secondary | ICD-10-CM

## 2022-08-08 DIAGNOSIS — M549 Dorsalgia, unspecified: Secondary | ICD-10-CM | POA: Diagnosis present

## 2022-08-08 LAB — POC URINE PREG, ED: Preg Test, Ur: NEGATIVE

## 2022-08-08 LAB — BASIC METABOLIC PANEL
Anion gap: 6 (ref 5–15)
BUN: 13 mg/dL (ref 6–20)
CO2: 22 mmol/L (ref 22–32)
Calcium: 8.7 mg/dL — ABNORMAL LOW (ref 8.9–10.3)
Chloride: 107 mmol/L (ref 98–111)
Creatinine, Ser: 0.64 mg/dL (ref 0.44–1.00)
GFR, Estimated: >60 mL/min (ref >60)
Glucose, Bld: 103 mg/dL — ABNORMAL HIGH (ref 70–99)
Potassium: 4.4 mmol/L (ref 3.5–5.1)
Sodium: 135 mmol/L (ref 135–145)

## 2022-08-08 LAB — URINALYSIS, ROUTINE W REFLEX MICROSCOPIC
Bilirubin Urine: NEGATIVE
Glucose, UA: NEGATIVE mg/dL
Hgb urine dipstick: NEGATIVE
Ketones, ur: NEGATIVE mg/dL
Nitrite: NEGATIVE
Protein, ur: NEGATIVE mg/dL
Specific Gravity, Urine: 1.006 (ref 1.005–1.030)
WBC, UA: NONE SEEN WBC/hpf (ref 0–5)
pH: 6 (ref 5.0–8.0)

## 2022-08-08 LAB — CBC
HCT: 38.9 % (ref 36.0–46.0)
Hemoglobin: 13.1 g/dL (ref 12.0–15.0)
MCH: 29.8 pg (ref 26.0–34.0)
MCHC: 33.7 g/dL (ref 30.0–36.0)
MCV: 88.4 fL (ref 80.0–100.0)
Platelets: 270 10*3/uL (ref 150–400)
RBC: 4.4 MIL/uL (ref 3.87–5.11)
RDW: 12.6 % (ref 11.5–15.5)
WBC: 5.9 10*3/uL (ref 4.0–10.5)
nRBC: 0 % (ref 0.0–0.2)

## 2022-08-08 LAB — TROPONIN I (HIGH SENSITIVITY): Troponin I (High Sensitivity): 2 ng/L (ref <18)

## 2022-08-08 MED ORDER — KETOROLAC TROMETHAMINE 30 MG/ML IJ SOLN
30.0000 mg | Freq: Once | INTRAMUSCULAR | Status: AC
  Administered 2022-08-08: 30 mg via INTRAMUSCULAR
  Filled 2022-08-08: qty 1

## 2022-08-08 NOTE — ED Notes (Signed)
See triage note  Presents with mid to upper back pain which started about 4 am  States she is having some SOB and slight nausea  Afebrile on arrival

## 2022-08-08 NOTE — ED Provider Notes (Signed)
[facility] Provider Note    Event Date/Time   First MD Initiated Contact with Patient 08/08/22 [phone]     (approximate)   History   Back Pain and Chest Pain   HPI  Anne Rogers is a 26 y.o. female   Modena Jansky to the ED with complaint of sudden onset of right-sided back pain that started at 4 AM this morning that eventually wrapped around to her chest.  Patient reports that she also has a lot of stress presently.  Patient reports nausea without vomiting, lightheadedness and shortness of breath.  Patient denies any injury.  Patient is right-hand dominant.  She denies any blurred vision, headache, cardiac history.  Patient currently is being treated for restless legs syndrome with gabapentin, history of anxiety and depression.      Physical Exam   Triage Vital Signs: ED Triage Vitals  Enc Vitals Group     BP 08/08/22 0732 (!) 142/92     Pulse Rate 08/08/22 0732 80     Resp 08/08/22 0732 18     Temp 08/08/22 0736 97.9 F (36.6 C)     Temp Source 08/08/22 0736 Oral     SpO2 08/08/22 0732 100 %     Weight 08/08/22 0732 279 lb (126.6 kg)     Height 08/08/22 0732 5\' 3"  (1.6 m)     Head Circumference --      Peak Flow --      Pain Score 08/08/22 0731 7     Pain Loc --      Pain Edu? --      Excl. in GC? --     Most recent vital signs: Vitals:   08/08/22 0852 08/08/22 1017  BP: (!) 140/88 138/80  Pulse: 68 60  Resp: 18 18  Temp:    SpO2: 100% 100%     General: Awake, no distress.  CV:  Good peripheral perfusion.  Heart regular rate and rhythm. Resp:  Normal effort.  Clear bilaterally. Abd:  No distention.  Other:  On examination of the back there is no gross deformity however there is tenderness on palpation of the paravertebral muscles right thoracic area.  Range of motion is slow and guarded secondary to increased pain with movement.  No point tenderness on palpation of the thoracic spine.  Patient is able to ambulate without any  assistance.   ED Results / Procedures / Treatments   Labs (all labs ordered are listed, but only abnormal results are displayed) Labs Reviewed  BASIC METABOLIC PANEL - Abnormal; Notable for the following components:      Result Value   Glucose, Bld 103 (*)    Calcium 8.7 (*)    All other components within normal limits  URINALYSIS, ROUTINE W REFLEX MICROSCOPIC - Abnormal; Notable for the following components:   Color, Urine STRAW (*)    APPearance CLEAR (*)    Leukocytes,Ua TRACE (*)    Bacteria, UA RARE (*)    All other components within normal limits  CBC  POC URINE PREG, ED  TROPONIN I (HIGH SENSITIVITY)  TROPONIN I (HIGH SENSITIVITY)     EKG Normal sinus rhythm Vent. rate 74 BPM PR interval 138 ms QRS duration 86 ms QT/QTcB 388/430 ms P-R-T axes 27 15 25    RADIOLOGY Chest x-ray images were reviewed and interpreted by myself independent of the radiologist and was negative for any acute cardiopulmonary changes.  Official radiology report is no active disease.    PROCEDURES:  Critical Care performed:   Procedures   MEDICATIONS ORDERED IN ED: Medications  ketorolac (TORADOL) 30 MG/ML injection 30 mg (30 mg Intramuscular Given 08/08/22 1043)     IMPRESSION / MDM / ASSESSMENT AND PLAN / ED COURSE  I reviewed the triage vital signs and the nursing notes.   Differential diagnosis includes, but is not limited to, back pain, UTI, kidney stone, chest pain, muscle skeletal pain.  26 year old female presents to the ED with complaint of right sided back pain that started approximately 4 AM this morning.  She states she took gabapentin without any relief.  She currently takes that for RLS.  She has had some nausea, lightheadedness and shortness of breath.  No previous cardiac history.  Patient was made aware that lab work including troponin was normal with troponin being 2, urinalysis negative, BMP negative and CBC unremarkable.  Chest x-ray was negative for acute  cardiopulmonary disease.  EKG showed normal sinus rhythm with a ventricular rate of 74.  She was given an injection of Toradol for muscle skeletal pain.  She is also strongly encouraged to follow-up with her PCP if any continued problems.      Patient's presentation is most consistent with acute complicated illness / injury requiring diagnostic workup.  FINAL CLINICAL IMPRESSION(S) / ED DIAGNOSES   Final diagnoses:  Musculoskeletal pain     Rx / DC Orders   ED Discharge Orders     None        Note:  This document was prepared using Dragon voice recognition software and may include unintentional dictation errors.   Tommi Rumps, PA-C 08/08/22 1133    Sharman Cheek, MD 08/08/22 1335

## 2022-08-08 NOTE — ED Triage Notes (Signed)
Pt to ED via POV from home. Pt reports sudden onset of back pain at 4am that wraps around to chest pain. Pt also endorses nausea, light headed and SOB. Pt denies injuries. Pt reports increased stressors at home. Pt denies blurry vision, HA or cardiac hx.

## 2022-08-08 NOTE — Discharge Instructions (Signed)
Call make an appointment with your primary care provider for further evaluation.  All your test in the emergency department were negative for any cardiac issues, pneumonia, bronchitis.  All lab work was negative.  You may also take ibuprofen or Aleve.  Ice or heat to your back as needed for discomfort.

## 2022-08-16 ENCOUNTER — Other Ambulatory Visit: Payer: Self-pay

## 2022-08-16 ENCOUNTER — Encounter: Payer: Self-pay | Admitting: Emergency Medicine

## 2022-08-16 DIAGNOSIS — H538 Other visual disturbances: Secondary | ICD-10-CM | POA: Diagnosis not present

## 2022-08-16 DIAGNOSIS — H5712 Ocular pain, left eye: Secondary | ICD-10-CM | POA: Diagnosis present

## 2022-08-16 NOTE — ED Triage Notes (Signed)
Pt arrived via POV with reports of L eye pain that started last night after taking out contacts, pt was seen at Urgent Care today and reports she was prescribed eye drops and has been using them, but did not bring them. Pt states she feels the drops are making her sxs worse.

## 2022-08-17 ENCOUNTER — Emergency Department
Admission: EM | Admit: 2022-08-17 | Discharge: 2022-08-17 | Disposition: A | Discharge status: Home / Self Care | Payer: Medicaid Other | Attending: Emergency Medicine | Admitting: Emergency Medicine

## 2022-08-17 DIAGNOSIS — H5712 Ocular pain, left eye: Secondary | ICD-10-CM

## 2022-08-17 MED ORDER — IBUPROFEN 400 MG PO TABS
400.0000 mg | ORAL_TABLET | Freq: Once | ORAL | Status: AC
  Administered 2022-08-17: 400 mg via ORAL
  Filled 2022-08-17: qty 1

## 2022-08-17 MED ORDER — TETRACAINE HCL 0.5 % OP SOLN
2.0000 [drp] | Freq: Once | OPHTHALMIC | Status: AC
  Administered 2022-08-17: 2 [drp] via OPHTHALMIC
  Filled 2022-08-17: qty 4

## 2022-08-17 MED ORDER — FLUORESCEIN SODIUM 1 MG OP STRP
1.0000 | ORAL_STRIP | Freq: Once | OPHTHALMIC | Status: AC
  Administered 2022-08-17: 1 via OPHTHALMIC
  Filled 2022-08-17: qty 1

## 2022-08-17 NOTE — Discharge Instructions (Signed)
Please continue to use the eyedrops that you are prescribed at urgent care.  Please follow-up with the ophthalmologist tomorrow.  Ideally I would like you to see them in the next 1 to 2 days.

## 2022-08-17 NOTE — ED Provider Notes (Signed)
[facility] Provider Note    Event Date/Time   First MD Initiated Contact with Patient 08/17/22 [phone]     (approximate)   History   Eye Pain   HPI  Mattalynn CHARIE PINKUS is a 26 y.o. female past medical history of anxiety depression who presents with left eye redness and pain.  This started 24 hours ago.  She notes redness and pain photophobia and feeling like there is glass in the eye.  She denies exposure to foreign body or chemical irritant.  Does endorse some blurred vision.  She wears contact lenses but is not wearing them currently.  Has had some drainage clear and yellow.  Denies runny nose, fevers or sore throat.  Past Medical History:  Diagnosis Date   Anxiety    Depression    Gall stones     Patient Active Problem List   Diagnosis Date Noted   Depression, postpartum 02/25/2021   Anxiety in acute stress reaction 02/25/2021   At risk for depressed mood during postpartum period 02/11/2021   BMI 40.0-44.9, adult (HCC) 07/22/2020   Gall stones      Physical Exam  Triage Vital Signs: ED Triage Vitals  Enc Vitals Group     BP 08/16/22 2215 (!) 140/79     Pulse Rate 08/16/22 2215 72     Resp 08/16/22 2215 20     Temp 08/16/22 2215 99.1 F (37.3 C)     Temp Source 08/16/22 2215 Oral     SpO2 08/16/22 2215 99 %     Weight 08/16/22 2216 283 lb (128.4 kg)     Height 08/16/22 2216 5\' 3"  (1.6 m)     Head Circumference --      Peak Flow --      Pain Score 08/16/22 2230 8     Pain Loc --      Pain Edu? --      Excl. in GC? --     Most recent vital signs: Vitals:   08/16/22 2215 08/17/22 0231  BP: (!) 140/79 128/73  Pulse: 72 68  Resp: 20 16  Temp: 99.1 F (37.3 C) 98.6 F (37 C)  SpO2: 99% 99%     General: Awake, no distress.  CV:  Good peripheral perfusion.  Resp:  Normal effort.  Abd:  No distention.  Neuro:             Awake, Alert, Oriented x 3  Other:  VA 20/20 R 20/50 L  Left eye with conjunctival injection PERRLA, EOMI  bilaterally No uptake on fluorescein exam IOP R 12 L 11   ED Results / Procedures / Treatments  Labs (all labs ordered are listed, but only abnormal results are displayed) Labs Reviewed - No data to display   EKG     RADIOLOGY    PROCEDURES:  Critical Care performed: No  Procedures   MEDICATIONS ORDERED IN ED: Medications  tetracaine (PONTOCAINE) 0.5 % ophthalmic solution 2 drop (2 drops Left Eye Given 08/17/22 0200)  fluorescein ophthalmic strip 1 strip (1 strip Left Eye Given 08/17/22 0200)  ibuprofen (ADVIL) tablet 400 mg (400 mg Oral Given 08/17/22 0228)     IMPRESSION / MDM / ASSESSMENT AND PLAN / ED COURSE  I reviewed the triage vital signs and the nursing notes.                              Patient's presentation  is most consistent with acute, uncomplicated illness.  Differential diagnosis includes, but is not limited to, conjunctivitis, keratitis, corneal abrasion, uveitis/iritis, scleritis, angle-closure glaucoma  Patient is a 26 year old female presenting with left eye redness and pain since yesterday.  She has some associated blurred vision foreign body sensation and photophobia.  On exam the left eye is significantly injected the injection does not clearly spare the limbus but pupils are equal round reactive and extraocular movements are intact.  No lid edema or swelling.  Performed fluorescein exam and there is no uptake to suggest keratitis or corneal abrasion.  Intraocular pressure is normal ruling out angle-closure glaucoma.  Viral or bacterial conjunctivitis certainly possible however the patient's degree of pain is more than I would expect and I would not expect her to have blurry vision related to this.  May have uveitis.  I recommended that she continue the ciprofloxacin drops given its possible that this is infectious but that she follow-up with ophthalmology within the next 1 to 2 days given the uncertainty in diagnosis.       FINAL CLINICAL  IMPRESSION(S) / ED DIAGNOSES   Final diagnoses:  Left eye pain     Rx / DC Orders   ED Discharge Orders     None        Note:  This document was prepared using Dragon voice recognition software and may include unintentional dictation errors.   Georga Hacking, MD 08/17/22 [phone]

## 2022-10-22 ENCOUNTER — Encounter: Payer: Self-pay | Admitting: Emergency Medicine

## 2022-10-22 ENCOUNTER — Other Ambulatory Visit: Payer: Self-pay

## 2022-10-22 ENCOUNTER — Emergency Department: Payer: Medicaid Other

## 2022-10-22 DIAGNOSIS — R1110 Vomiting, unspecified: Secondary | ICD-10-CM | POA: Insufficient documentation

## 2022-10-22 DIAGNOSIS — R079 Chest pain, unspecified: Secondary | ICD-10-CM | POA: Diagnosis not present

## 2022-10-22 DIAGNOSIS — Z5321 Procedure and treatment not carried out due to patient leaving prior to being seen by health care provider: Secondary | ICD-10-CM | POA: Diagnosis not present

## 2022-10-22 DIAGNOSIS — M546 Pain in thoracic spine: Secondary | ICD-10-CM | POA: Diagnosis present

## 2022-10-22 LAB — CBC
HCT: 39 % (ref 36.0–46.0)
Hemoglobin: 13 g/dL (ref 12.0–15.0)
MCH: 28.9 pg (ref 26.0–34.0)
MCHC: 33.3 g/dL (ref 30.0–36.0)
MCV: 86.7 fL (ref 80.0–100.0)
Platelets: 323 10*3/uL (ref 150–400)
RBC: 4.5 MIL/uL (ref 3.87–5.11)
RDW: 13.1 % (ref 11.5–15.5)
WBC: 8.8 10*3/uL (ref 4.0–10.5)
nRBC: 0 % (ref 0.0–0.2)

## 2022-10-22 LAB — BASIC METABOLIC PANEL
Anion gap: 7 (ref 5–15)
BUN: 12 mg/dL (ref 6–20)
CO2: 25 mmol/L (ref 22–32)
Calcium: 9.4 mg/dL (ref 8.9–10.3)
Chloride: 108 mmol/L (ref 98–111)
Creatinine, Ser: 0.87 mg/dL (ref 0.44–1.00)
GFR, Estimated: >60 mL/min (ref >60)
Glucose, Bld: 100 mg/dL — ABNORMAL HIGH (ref 70–99)
Potassium: 4.9 mmol/L (ref 3.5–5.1)
Sodium: 140 mmol/L (ref 135–145)

## 2022-10-22 LAB — TROPONIN I (HIGH SENSITIVITY): Troponin I (High Sensitivity): <2 ng/L (ref <18)

## 2022-10-22 LAB — POC URINE PREG, ED: Preg Test, Ur: NEGATIVE

## 2022-10-22 NOTE — ED Triage Notes (Signed)
Pt to ED via POV with c/o sudden onset back pain and CP. Pt states upper back between her shoulder blades to the bottom of her ribs. Pt states pain radiates to her chest. Pt states pain in her chest under her breasts. Pt states has has several episodes of emesis, noted some blood in vomit. Pt able to speak in full and complete sentences in triage. Pt states has had several episodes of same in the past. Pt states pain worse with deep breathing.

## 2022-10-23 ENCOUNTER — Emergency Department
Admission: EM | Admit: 2022-10-23 | Discharge: 2022-10-23 | Discharge status: Against Medical Advice | Payer: Medicaid Other | Attending: Emergency Medicine | Admitting: Emergency Medicine

## 2022-10-23 LAB — HEPATIC FUNCTION PANEL
ALT: 30 U/L (ref 0–44)
AST: 21 U/L (ref 15–41)
Albumin: 3.5 g/dL (ref 3.5–5.0)
Alkaline Phosphatase: 47 U/L (ref 38–126)
Bilirubin, Direct: <0.1 mg/dL (ref 0.0–0.2)
Total Bilirubin: 0.4 mg/dL (ref 0.3–1.2)
Total Protein: 7.1 g/dL (ref 6.5–8.1)

## 2022-10-23 LAB — LIPASE, BLOOD: Lipase: 35 U/L (ref 11–51)

## 2023-04-13 ENCOUNTER — Emergency Department: Payer: Medicaid Other

## 2023-04-13 ENCOUNTER — Other Ambulatory Visit: Payer: Self-pay

## 2023-04-13 ENCOUNTER — Observation Stay: Payer: Medicaid Other

## 2023-04-13 ENCOUNTER — Observation Stay
Admission: EM | Admit: 2023-04-13 | Discharge: 2023-04-14 | Disposition: A | Discharge status: Home / Self Care | Payer: Medicaid Other | Attending: Internal Medicine | Admitting: Internal Medicine

## 2023-04-13 ENCOUNTER — Encounter: Payer: Self-pay | Admitting: Emergency Medicine

## 2023-04-13 DIAGNOSIS — F418 Other specified anxiety disorders: Secondary | ICD-10-CM | POA: Diagnosis present

## 2023-04-13 DIAGNOSIS — E876 Hypokalemia: Secondary | ICD-10-CM | POA: Diagnosis present

## 2023-04-13 DIAGNOSIS — F1410 Cocaine abuse, uncomplicated: Secondary | ICD-10-CM | POA: Diagnosis not present

## 2023-04-13 DIAGNOSIS — Z1152 Encounter for screening for COVID-19: Secondary | ICD-10-CM | POA: Diagnosis not present

## 2023-04-13 DIAGNOSIS — R0602 Shortness of breath: Secondary | ICD-10-CM | POA: Insufficient documentation

## 2023-04-13 DIAGNOSIS — F17210 Nicotine dependence, cigarettes, uncomplicated: Secondary | ICD-10-CM | POA: Diagnosis not present

## 2023-04-13 DIAGNOSIS — G9341 Metabolic encephalopathy: Principal | ICD-10-CM | POA: Diagnosis present

## 2023-04-13 DIAGNOSIS — F1910 Other psychoactive substance abuse, uncomplicated: Secondary | ICD-10-CM | POA: Diagnosis present

## 2023-04-13 DIAGNOSIS — R4182 Altered mental status, unspecified: Secondary | ICD-10-CM

## 2023-04-13 DIAGNOSIS — T50901A Poisoning by unspecified drugs, medicaments and biological substances, accidental (unintentional), initial encounter: Secondary | ICD-10-CM | POA: Diagnosis present

## 2023-04-13 DIAGNOSIS — F1490 Cocaine use, unspecified, uncomplicated: Secondary | ICD-10-CM

## 2023-04-13 LAB — TROPONIN I (HIGH SENSITIVITY): Troponin I (High Sensitivity): <2 ng/L (ref <18)

## 2023-04-13 LAB — URINALYSIS, ROUTINE W REFLEX MICROSCOPIC
Bacteria, UA: NONE SEEN
Bilirubin Urine: NEGATIVE
Glucose, UA: NEGATIVE mg/dL
Ketones, ur: NEGATIVE mg/dL
Nitrite: NEGATIVE
Protein, ur: NEGATIVE mg/dL
RBC / HPF: >50 RBC/hpf (ref 0–5)
Specific Gravity, Urine: 1.032 — ABNORMAL HIGH (ref 1.005–1.030)
pH: 7 (ref 5.0–8.0)

## 2023-04-13 LAB — URINE DRUG SCREEN, QUALITATIVE (ARMC ONLY)
Amphetamines, Ur Screen: NOT DETECTED
Barbiturates, Ur Screen: NOT DETECTED
Benzodiazepine, Ur Scrn: NOT DETECTED
Cannabinoid 50 Ng, Ur ~~LOC~~: NOT DETECTED
Cocaine Metabolite,Ur ~~LOC~~: POSITIVE — AB
MDMA (Ecstasy)Ur Screen: NOT DETECTED
Methadone Scn, Ur: NOT DETECTED
Opiate, Ur Screen: NOT DETECTED
Phencyclidine (PCP) Ur S: NOT DETECTED
Tricyclic, Ur Screen: POSITIVE — AB

## 2023-04-13 LAB — COMPREHENSIVE METABOLIC PANEL
ALT: 30 U/L (ref 0–44)
AST: 34 U/L (ref 15–41)
Albumin: 3.5 g/dL (ref 3.5–5.0)
Alkaline Phosphatase: 52 U/L (ref 38–126)
Anion gap: 12 (ref 5–15)
BUN: 11 mg/dL (ref 6–20)
CO2: 23 mmol/L (ref 22–32)
Calcium: 8.8 mg/dL — ABNORMAL LOW (ref 8.9–10.3)
Chloride: 107 mmol/L (ref 98–111)
Creatinine, Ser: 0.94 mg/dL (ref 0.44–1.00)
GFR, Estimated: >60 mL/min (ref >60)
Glucose, Bld: 91 mg/dL (ref 70–99)
Potassium: 3.4 mmol/L — ABNORMAL LOW (ref 3.5–5.1)
Sodium: 142 mmol/L (ref 135–145)
Total Bilirubin: 0.3 mg/dL (ref 0.3–1.2)
Total Protein: 7.2 g/dL (ref 6.5–8.1)

## 2023-04-13 LAB — CBC WITH DIFFERENTIAL/PLATELET
Abs Immature Granulocytes: 0.04 10*3/uL (ref 0.00–0.07)
Basophils Absolute: 0 10*3/uL (ref 0.0–0.1)
Basophils Relative: 0 %
Eosinophils Absolute: 0 10*3/uL (ref 0.0–0.5)
Eosinophils Relative: 0 %
HCT: 42.7 % (ref 36.0–46.0)
Hemoglobin: 14.3 g/dL (ref 12.0–15.0)
Immature Granulocytes: 0 %
Lymphocytes Relative: 16 %
Lymphs Abs: 1.5 10*3/uL (ref 0.7–4.0)
MCH: 29.5 pg (ref 26.0–34.0)
MCHC: 33.5 g/dL (ref 30.0–36.0)
MCV: 88 fL (ref 80.0–100.0)
Monocytes Absolute: 0.6 10*3/uL (ref 0.1–1.0)
Monocytes Relative: 6 %
Neutro Abs: 7.3 10*3/uL (ref 1.7–7.7)
Neutrophils Relative %: 78 %
Platelets: 311 10*3/uL (ref 150–400)
RBC: 4.85 MIL/uL (ref 3.87–5.11)
RDW: 13.6 % (ref 11.5–15.5)
WBC: 9.5 10*3/uL (ref 4.0–10.5)
nRBC: 0 % (ref 0.0–0.2)

## 2023-04-13 LAB — LIPASE, BLOOD: Lipase: 27 U/L (ref 11–51)

## 2023-04-13 LAB — CK: Total CK: 60 U/L (ref 38–234)

## 2023-04-13 LAB — SARS CORONAVIRUS 2 BY RT PCR: SARS Coronavirus 2 by RT PCR: NEGATIVE

## 2023-04-13 LAB — BRAIN NATRIURETIC PEPTIDE: B Natriuretic Peptide: 9.4 pg/mL (ref 0.0–100.0)

## 2023-04-13 LAB — ACETAMINOPHEN LEVEL
Acetaminophen (Tylenol), Serum: <10 ug/mL — ABNORMAL LOW (ref 10–30)
Acetaminophen (Tylenol), Serum: <10 ug/mL — ABNORMAL LOW (ref 10–30)

## 2023-04-13 LAB — ETHANOL: Alcohol, Ethyl (B): <10 mg/dL (ref <10)

## 2023-04-13 LAB — SALICYLATE LEVEL: Salicylate Lvl: <7 mg/dL — ABNORMAL LOW (ref 7.0–30.0)

## 2023-04-13 LAB — AMMONIA: Ammonia: 34 umol/L (ref 9–35)

## 2023-04-13 LAB — TSH: TSH: 4.065 u[IU]/mL (ref 0.350–4.500)

## 2023-04-13 LAB — HCG, QUANTITATIVE, PREGNANCY: hCG, Beta Chain, Quant, S: <1 m[IU]/mL (ref <5)

## 2023-04-13 LAB — MAGNESIUM: Magnesium: 2.2 mg/dL (ref 1.7–2.4)

## 2023-04-13 MED ORDER — ONDANSETRON HCL 4 MG/2ML IJ SOLN: 4.0000 mg | Freq: Three times a day (TID) | INTRAMUSCULAR | Status: DC | PRN

## 2023-04-13 MED ORDER — ENOXAPARIN SODIUM 80 MG/0.8ML IJ SOSY
70.0000 mg | PREFILLED_SYRINGE | INTRAMUSCULAR | Status: DC
  Administered 2023-04-13: 70 mg via SUBCUTANEOUS
  Filled 2023-04-13: qty 0.7

## 2023-04-13 MED ORDER — BUPROPION HCL ER (XL) 150 MG PO TB24: 150.0000 mg | ORAL_TABLET | Freq: Every day | ORAL | Status: DC

## 2023-04-13 MED ORDER — IOHEXOL 350 MG/ML SOLN
75.0000 mL | Freq: Once | INTRAVENOUS | Status: AC | PRN
  Administered 2023-04-13: 75 mL via INTRAVENOUS

## 2023-04-13 MED ORDER — ONDANSETRON HCL 4 MG/2ML IJ SOLN
4.0000 mg | Freq: Once | INTRAMUSCULAR | Status: AC
  Administered 2023-04-13: 4 mg via INTRAVENOUS
  Filled 2023-04-13: qty 2

## 2023-04-13 MED ORDER — SERTRALINE HCL 50 MG PO TABS: 100.0000 mg | ORAL_TABLET | Freq: Every day | ORAL | Status: DC

## 2023-04-13 MED ORDER — NORGESTIMATE-ETH ESTRADIOL 0.25-35 MG-MCG PO TABS: 1.0000 | ORAL_TABLET | Freq: Every day | ORAL | Status: DC

## 2023-04-13 MED ORDER — SODIUM CHLORIDE 0.9 % IV BOLUS
1000.0000 mL | Freq: Once | INTRAVENOUS | Status: AC
  Administered 2023-04-13: 1000 mL via INTRAVENOUS

## 2023-04-13 MED ORDER — ACETAMINOPHEN 325 MG PO TABS: 650.0000 mg | ORAL_TABLET | Freq: Four times a day (QID) | ORAL | Status: DC | PRN

## 2023-04-13 MED ORDER — ONDANSETRON HCL 4 MG/2ML IJ SOLN: 4.0000 mg | Freq: Once | INTRAMUSCULAR | Status: DC

## 2023-04-13 MED ORDER — LORAZEPAM 2 MG/ML IJ SOLN: 1.0000 mg | INTRAMUSCULAR | Status: DC | PRN

## 2023-04-13 MED ORDER — NICOTINE 21 MG/24HR TD PT24
21.0000 mg | MEDICATED_PATCH | Freq: Every day | TRANSDERMAL | Status: DC
  Filled 2023-04-13: qty 1

## 2023-04-13 MED ORDER — POTASSIUM CHLORIDE CRYS ER 20 MEQ PO TBCR
40.0000 meq | EXTENDED_RELEASE_TABLET | Freq: Once | ORAL | Status: AC
  Administered 2023-04-13: 40 meq via ORAL
  Filled 2023-04-13: qty 2

## 2023-04-13 MED ORDER — PANTOPRAZOLE SODIUM 40 MG PO TBEC
40.0000 mg | DELAYED_RELEASE_TABLET | Freq: Every day | ORAL | Status: DC
  Administered 2023-04-13: 40 mg via ORAL
  Filled 2023-04-13: qty 1

## 2023-04-13 NOTE — ED Triage Notes (Signed)
Pt via [facility] from home. Pt's fiance called out for "unresponsiveness" and possible seizure. Per EMS, fiance found pt on the couch covered in vomit. Fiance helped pt walk into the kitchen and pt went "unresponsive" and a possible although no seizure like activity was not witnessed. Pt has no hx of seizures. On EMS arrival, EMS states pt seemed post-ictal. Pt is looking around and responsive to painful stimuli. Denies any drug use. EMS report pupils are equal but slow to react. On arrival, pt is responsive to voice.

## 2023-04-13 NOTE — Consult Note (Addendum)
1230: Attempted consult on patient. She is very somnolent and opens eyes for a few seconds when her name is called, then goes back to sleep. Boyfriend Thayer Ohm) is at bedside.  Collateral from [place], in private room away from patient. Thayer Ohm reports that he went to bed about 10:30 last night and patient was fine. He woke at 5:30 this morning to find patient on the couch, covered in vomit. He was able to wake her up, she was talking, but she was not making any sense. Patient was walking and fell back; he called EMS.  Thayer Ohm has not noticed anything different in patient's behavior. The only change in circumstance is that patient's best friend was living with them and they had to have her evicted last week. 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 Patient has 2 children, 1 lives with them full-time; the other she shares 50/50 custody with the child's father.   Will continue to make attempts to consult patient.  Vanetta Mulders, PMHNP   [phone]: Chart notes from Dr. Fuller Plan this morning state that "8:51 AM there was a report from the dad she had sent goodbye text messages so unclear if this was potentially a suicide attempt."    I obtained collateral from patient's father, Ytzel Gubler ([phone]), who states that the text he got from patient about 12:30 this morning did not say  "goodbye," but "I love you all and thanks for everything you have done." He did not see the text until later this morning. He said he didn't think anything of it until he found out what happened this morning. It concerned him. He reports that he has asked her if this was a suicide attempt, and she denied it. Father does not report any previous psychiatric history from when she was younger. He thinks she may have been on an antidepressant at some point in the last few years.    1745:  I tried again to do consult on this patient. She continues to not be alert or  responsive enough for accurate psych assessment at this time. I asked her twice if she took any drugs and she replies "no," even when told she is positive for cocaine. When asked if she was trying to harm/kill herself, she says "no." Patient will remain on the consult list for another provider to see her when she is more alert and an accurate assessment can be obtained.  Patient is under the medical service with Dr. Clyde Lundborg; reviewed plan with him via secure chat.   NOTE: HIPAA compliance was maintained. I did not disclose any information regarding patient's UDS test results to anyone other than patient, in private.   Vanetta Mulders, PMHNP

## 2023-04-13 NOTE — BH Assessment (Signed)
TTS Consult not completed. Patient admitted medically.

## 2023-04-13 NOTE — Procedures (Signed)
History: 27 year old female being evaluated for possible seizure  Sedation: None  Technique: This EEG was acquired with electrodes placed according to the International 10-20 electrode system (including Fp1, Fp2, F3, F4, C3, C4, P3, P4, O1, O2, T3, T4, T5, T6, A1, A2, Fz, Cz, Pz). The following electrodes were missing or displaced: none.   Background: The vast majority of this EEG was performed during sleep with frequent K complexes.  These are symmetric in distribution.  With brief arousal, there is a clearly delineated well-formed posterior dominant rhythm of 10 Hz seen.  Photic stimulation: Physiologic driving is not performed  EEG Abnormalities: None   Clinical Interpretation: This normal EEG is recorded in the waking and sleep state. There was no seizure or seizure predisposition recorded on this study. Please note that lack of epileptiform activity on EEG does not preclude the possibility of epilepsy.   Ritta Slot, MD [facility] [phone]  If 7pm- 7am, please page neurology on call as listed in [facility].

## 2023-04-13 NOTE — BH Assessment (Signed)
TTS unable complete consult. Patient too lethargic to participate in the interview.

## 2023-04-13 NOTE — H&P (Signed)
History and Physical    Anne Rogers:[id number] DOB: Jul 03, 1996 DOA: 04/13/2023  Referring MD/NP/PA:   PCP: Lorretta Harp, MD   Patient coming from:  The patient is coming from home.    Chief Complaint: AMS  HPI: Anne Rogers is a 27 y.o. female with medical history significant of cocaine abuse, tobacco abuse, depression with anxiety, morbid obesity, gallstone, who presents with altered mental status.  Patient has AMS,  and is unable to provide accurate medical history, therefore, most of the history is obtained by discussing the case with ED physician, per EMS report, and with the nursing staff. Her mother-in law is at bedside, who dose not know what has happened exactly. Per report, pt was last known normal at about 10:30 PM yesterday. Her boyfriend found pt on couch with confusion, covered in vomit this AM. Her boyfriend was able to wake her up. Pt was talking, but she was not making any sense. Patient was walking and fell back. He called EMS. There was a question of maybe a seizure, but her boyfriend never witnessed any seizure-like activity per EMS report. When I saw pt, pt is awake, but confused. Pt know her own name, knows that she is in the hospital, but confused year to 2021.  She moves all extremities.  Per report, patient had nausea and vomiting, but currently no active nausea, vomiting and diarrhea. She denies chest pain or abdominal pain.  No acute respiratory distress or cough.  Not sure if patient has symptoms of UTI.   Data reviewed independently and ED Course: pt was found to have WBC 9.5, positive UDS for cocaine and TCA, negative pregnancy test, CK level 60, urinalysis negative, negative PCR for COVID, GFR> 60, potassium 3.4, temperature normal, blood pressure 105/72, heart rate 96, RR 28, oxygen saturation 96% on room air. Pt is placed in PCU for obs.  CTA of head and neck: 1. Unremarkable CTA of the head and neck. 2. Mildly enlarged main pulmonary artery, suggestive  of pulmonary hypertension.  CT-head and neck:  1. No acute intracranial abnormality. 2. No acute fracture or subluxation of the cervical spine. 3. Biapical ground-glass opacities, nonspecific, likely due to expiratory phase of imaging.  CXR: MPRESSION: Prominent bilateral opacities could represent bronchovascular crowding in the setting of low lung volumes or mild pulmonary edema  EKG: I have personally reviewed.  Sinus rhythm, QTc 484, LAE, poor R wave progression   Review of Systems: could not be reviewed accurately due to altered mental status.   Allergy: No Known Allergies  Past Medical History:  Diagnosis Date   Anxiety    Depression    Gall stones     Past Surgical History:  Procedure Laterality Date   ABDOMINAL SURGERY     INDUCED ABORTION  02/2015   INDUCED ABORTION     x 2   INDUCED ABORTION     x 3    Social History:  reports that she has been smoking cigarettes. She has a 0.38 pack-year smoking history. She has never used smokeless tobacco. She reports that she does not currently use alcohol. She reports current drug use. Drug: "Crack" cocaine.  Family History:  Family History  Problem Relation Age of Onset   Cancer Mother    Cancer Father      Prior to Admission medications   Medication Sig Start Date End Date Taking? Authorizing Provider  gabapentin (NEURONTIN) 100 MG capsule Take 100 mg by mouth at bedtime. 05/16/22   [provider]  hydrOXYzine (ATARAX) 10 MG tablet Take 10 mg by mouth 3 (three) times daily as needed. 04/26/22   [provider]  LORazepam (ATIVAN) 0.5 MG tablet Take 0.5 mg by mouth daily as needed. 05/10/22   [provider]  norethindrone (MICRONOR) 0.35 MG tablet Take 1 tablet (0.35 mg total) by mouth daily. 04/26/21   Nadara Mustard, MD  sertraline (ZOLOFT) 100 MG tablet Take 1 tablet (100 mg total) by mouth daily. 07/02/21   Nadara Mustard, MD  SPRINTEC 28 0.25-35 MG-MCG tablet Take 1 tablet by mouth  daily. 08/04/22   [provider]  VICTOZA 18 MG/3ML SOPN Inject into the skin. 06/10/22   [provider]    Physical Exam: Vitals:   04/13/23 1003 04/13/23 1200 04/13/23 1341 04/13/23 1423  BP:  105/72    Pulse:  96    Resp:  (!) 26    Temp: 98.5 F (36.9 C)   98.4 F (36.9 C)  TempSrc: Oral   Oral  SpO2:  96%    Weight:   (!) 138.3 kg   Height:   5\' 3"  (1.6 m)    General: Not in acute distress HEENT:       Eyes: PERRL, EOMI, no scleral icterus.       ENT: No discharge from the ears and nose       Neck: No JVD, no bruit, no mass felt. Heme: No neck lymph node enlargement. Cardiac: S1/S2, RRR, No murmurs, No gallops or rubs. Respiratory: No rales, wheezing, rhonchi or rubs. GI: Soft, nondistended, nontender, no organomegaly, BS present. GU: No hematuria Ext: No pitting leg edema bilaterally. 1+DP/PT pulse bilaterally. Musculoskeletal: No joint deformities, No joint redness or warmth, no limitation of ROM in spin. Skin: No rashes.  Neuro: Confused, knows her own name, knows that she is in hospital, not orientated to the year,  cranial nerves II-XII grossly intact, moves all extremities.  Psych: Patient is not psychotic, no suicidal or hemocidal ideation.  Labs on Admission: I have personally reviewed following labs and imaging studies  CBC: Recent Labs  Lab 04/13/23 0804  WBC 9.5  NEUTROABS 7.3  HGB 14.3  HCT 42.7  MCV 88.0  PLT 311   Basic Metabolic Panel: Recent Labs  Lab 04/13/23 0804  NA 142  K 3.4*  CL 107  CO2 23  GLUCOSE 91  BUN 11  CREATININE 0.94  CALCIUM 8.8*  MG 2.2   GFR: Estimated Creatinine Clearance: 122.1 mL/min (by C-G formula based on SCr of 0.94 mg/dL). Liver Function Tests: Recent Labs  Lab 04/13/23 0804  AST 34  ALT 30  ALKPHOS 52  BILITOT 0.3  PROT 7.2  ALBUMIN 3.5   Recent Labs  Lab 04/13/23 0804  LIPASE 27   No results for input(s): "AMMONIA" in the last 168 hours. Coagulation Profile: No results  for input(s): "INR", "PROTIME" in the last 168 hours. Cardiac Enzymes: Recent Labs  Lab 04/13/23 0804  CKTOTAL 60   BNP (last 3 results) No results for input(s): "PROBNP" in the last 8760 hours. HbA1C: No results for input(s): "HGBA1C" in the last 72 hours. CBG: No results for input(s): "GLUCAP" in the last 168 hours. Lipid Profile: No results for input(s): "CHOL", "HDL", "LDLCALC", "TRIG", "CHOLHDL", "LDLDIRECT" in the last 72 hours. Thyroid Function Tests: No results for input(s): "TSH", "T4TOTAL", "FREET4", "T3FREE", "THYROIDAB" in the last 72 hours. Anemia Panel: No results for input(s): "VITAMINB12", "FOLATE", "FERRITIN", "TIBC", "IRON", "RETICCTPCT" in the  last 72 hours. Urine analysis:    Component Value Date/Time   COLORURINE STRAW (A) 04/13/2023 0804   APPEARANCEUR HAZY (A) 04/13/2023 0804   APPEARANCEUR Hazy 09/26/2012 0804   LABSPEC 1.032 (H) 04/13/2023 0804   LABSPEC 1.019 09/26/2012 0804   PHURINE 7.0 04/13/2023 0804   GLUCOSEU NEGATIVE 04/13/2023 0804   GLUCOSEU Negative 09/26/2012 0804   HGBUR MODERATE (A) 04/13/2023 0804   BILIRUBINUR NEGATIVE 04/13/2023 0804   BILIRUBINUR Negative 09/26/2012 0804   KETONESUR NEGATIVE 04/13/2023 0804   PROTEINUR NEGATIVE 04/13/2023 0804   NITRITE NEGATIVE 04/13/2023 0804   LEUKOCYTESUR TRACE (A) 04/13/2023 0804   LEUKOCYTESUR 1+ 09/26/2012 0804   Sepsis Labs: (procalcitonin:4,lacticidven:4) ) Recent Results (from the past 240 hour(s))  SARS Coronavirus 2 by RT PCR (hospital order, performed in [facility] hospital lab) *cepheid single result test* Anterior Nasal Swab     Status: None   Collection Time: 04/13/23 10:36 AM   Specimen: Anterior Nasal Swab  Result Value Ref Range Status   SARS Coronavirus 2 by RT PCR NEGATIVE NEGATIVE Final    Comment: (NOTE) SARS-CoV-2 target nucleic acids are NOT DETECTED.  The SARS-CoV-2 RNA is generally detectable in upper and lower respiratory specimens during the acute  phase of infection. The lowest concentration of SARS-CoV-2 viral copies this assay can detect is 250 copies / mL. A negative result does not preclude SARS-CoV-2 infection and should not be used as the sole basis for treatment or other patient management decisions.  A negative result may occur with improper specimen collection / handling, submission of specimen other than nasopharyngeal swab, presence of viral mutation(s) within the areas targeted by this assay, and inadequate number of viral copies (<250 copies / mL). A negative result must be combined with clinical observations, patient history, and epidemiological information.  Fact Sheet for Patients:   RoadLapTop.co.za  Fact Sheet for Healthcare Providers: http://kim-miller.com/  This test is not yet approved or  cleared by the [place] FDA and has been authorized for detection and/or diagnosis of SARS-CoV-2 by FDA under an Emergency Use Authorization (EUA).  This EUA will remain in effect (meaning this test can be used) for the duration of the COVID-19 declaration under Section 564(b)(1) of the Act, 21 U.S.C. section 360bbb-3(b)(1), unless the authorization is terminated or revoked sooner.  Performed at [facility], [address] [place], [place] [address]      Radiological Exams on Admission: CT ANGIO HEAD NECK W WO CM  Result Date: 04/13/2023 CLINICAL DATA:  Neuro deficit, acute, stroke suspected. EXAM: CT ANGIOGRAPHY HEAD AND NECK WITH AND WITHOUT CONTRAST TECHNIQUE: Multidetector CT imaging of the head and neck was performed using the standard protocol during bolus administration of intravenous contrast. Multiplanar CT image reconstructions and MIPs were obtained to evaluate the vascular anatomy. Carotid stenosis measurements (when applicable) are obtained utilizing NASCET criteria, using the distal internal carotid diameter as the denominator. RADIATION DOSE  REDUCTION: This exam was performed according to the departmental dose-optimization program which includes automated exposure control, adjustment of the mA and/or kV according to patient size and/or use of iterative reconstruction technique. CONTRAST:  75mL OMNIPAQUE IOHEXOL 350 MG/ML SOLN COMPARISON:  Head CT 04/13/2023. FINDINGS: CTA NECK FINDINGS Aortic arch: Three-vessel arch configuration. Arch vessel origins are patent. Right carotid system: No evidence of dissection, stenosis (50% or greater), or occlusion. Left carotid system: No evidence of dissection, stenosis (50% or greater), or occlusion. Vertebral arteries: Codominant. No evidence of dissection, stenosis (50% or greater), or occlusion.  Skeleton: Normal. Other neck: Unremarkable. Upper chest: Mildly enlarged main pulmonary artery, measuring up to 3.0 cm (image 317 series 8) and suggestive of pulmonary hypertension. Review of the MIP images confirms the above findings CTA HEAD FINDINGS Anterior circulation: Intracranial ICAs are patent without stenosis or aneurysm. The proximal ACAs and MCAs are patent without stenosis or aneurysm. Distal branches are symmetric. Posterior circulation: Normal basilar artery. The SCAs, AICAs and PICAs are patent proximally. The PCAs are patent proximally without stenosis or aneurysm. Distal branches are symmetric. Venous sinuses: As permitted by contrast timing, patent. Anatomic variants: None. Review of the MIP images confirms the above findings IMPRESSION: 1. Unremarkable CTA of the head and neck. 2. Mildly enlarged main pulmonary artery, suggestive of pulmonary hypertension. Electronically Signed   By: Orvan Falconer M.D.   On: 04/13/2023 11:07   CT HEAD WO CONTRAST ( )  Result Date: 04/13/2023 CLINICAL DATA:  Cervical trauma EXAM: CT HEAD WITHOUT CONTRAST CT CERVICAL SPINE WITHOUT CONTRAST TECHNIQUE: Multidetector CT imaging of the head and cervical spine was performed following the standard protocol without  intravenous contrast. Multiplanar CT image reconstructions of the cervical spine were also generated. RADIATION DOSE REDUCTION: This exam was performed according to the departmental dose-optimization program which includes automated exposure control, adjustment of the mA and/or kV according to patient size and/or use of iterative reconstruction technique. COMPARISON:  None Available. FINDINGS: CT HEAD FINDINGS Brain: No evidence of acute infarction, hemorrhage, hydrocephalus, extra-axial collection or mass lesion/mass effect. Vascular: No hyperdense vessel or unexpected calcification. Skull: Normal. Negative for fracture or focal lesion. Sinuses/Orbits: No middle ear or mastoid effusion. Paranasal sinuses are notable for polypoid mucosal thickening left maxillary sinus. Orbits are unremarkable. Other: None. CT CERVICAL SPINE FINDINGS Alignment: Straightening of the normal cervical lordosis. Skull base and vertebrae: No acute fracture. No primary bone lesion or focal pathologic process. Soft tissues and spinal canal: No prevertebral fluid or swelling. No visible canal hematoma. Disc levels:  No evidence of high-grade spinal canal stenosis. Upper chest: Biapical ground-glass opacities, nonspecific, likely infectious or inflammatory. Other: None IMPRESSION: 1. No acute intracranial abnormality. 2. No acute fracture or subluxation of the cervical spine. 3. Biapical ground-glass opacities, nonspecific, likely due to expiratory phase of imaging. Electronically Signed   By: Lorenza Cambridge M.D.   On: 04/13/2023 08:49   CT Cervical Spine Wo Contrast  Result Date: 04/13/2023 CLINICAL DATA:  Cervical trauma EXAM: CT HEAD WITHOUT CONTRAST CT CERVICAL SPINE WITHOUT CONTRAST TECHNIQUE: Multidetector CT imaging of the head and cervical spine was performed following the standard protocol without intravenous contrast. Multiplanar CT image reconstructions of the cervical spine were also generated. RADIATION DOSE REDUCTION: This  exam was performed according to the departmental dose-optimization program which includes automated exposure control, adjustment of the mA and/or kV according to patient size and/or use of iterative reconstruction technique. COMPARISON:  None Available. FINDINGS: CT HEAD FINDINGS Brain: No evidence of acute infarction, hemorrhage, hydrocephalus, extra-axial collection or mass lesion/mass effect. Vascular: No hyperdense vessel or unexpected calcification. Skull: Normal. Negative for fracture or focal lesion. Sinuses/Orbits: No middle ear or mastoid effusion. Paranasal sinuses are notable for polypoid mucosal thickening left maxillary sinus. Orbits are unremarkable. Other: None. CT CERVICAL SPINE FINDINGS Alignment: Straightening of the normal cervical lordosis. Skull base and vertebrae: No acute fracture. No primary bone lesion or focal pathologic process. Soft tissues and spinal canal: No prevertebral fluid or swelling. No visible canal hematoma. Disc levels:  No evidence of high-grade spinal canal stenosis. Upper chest:  Biapical ground-glass opacities, nonspecific, likely infectious or inflammatory. Other: None IMPRESSION: 1. No acute intracranial abnormality. 2. No acute fracture or subluxation of the cervical spine. 3. Biapical ground-glass opacities, nonspecific, likely due to expiratory phase of imaging. Electronically Signed   By: Lorenza Cambridge M.D.   On: 04/13/2023 08:49   DG Chest Portable 1 View  Result Date: 04/13/2023 CLINICAL DATA:  Shortness of breath EXAM: PORTABLE CHEST 1 VIEW COMPARISON:  CXR 10/22/22 FINDINGS: No pleural effusion. No pneumothorax. Low lung volumes. Prominent bilateral opacities could represent bronchovascular crowding in the setting of low lung volumes or mild pulmonary edema. No focal consolidative opacity. No radiographically apparent displaced rib fractures. Visualized upper abdomen is unremarkable. IMPRESSION: Prominent bilateral opacities could represent bronchovascular  crowding in the setting of low lung volumes or mild pulmonary edema Electronically Signed   By: Lorenza Cambridge M.D.   On: 04/13/2023 08:22      Assessment/Plan Principal Problem:   Acute metabolic encephalopathy Active Problems:   Depression with anxiety   Polysubstance abuse   Hypokalemia   Morbid obesity   Assessment and Plan:  Acute metabolic encephalopathy: Etiology is not clear.  UDS positive for cocaine and TCA.  CT head negative.  Patient had questionable seizure.  Consulted Dr. Amada Jupiter of neurology.  -Placed on PCU for observation -Frequent neurocheck -Fall precaution -EEG per Dr. Amada Jupiter  Depression with anxiety -Hold Zoloft and Wellbutrin since we are not sure if patient overdosed these medications  Polysubstance abuse: Cocaine and tobacco -Nicotine patch  Hypokalemia: Potassium 3.4, magnesium 2.2 -Repleted potassium  Morbid obesity: Body weight 138.3 kg, BMI 54.01 -When patient's mental status improves, will need counseling for healthy diet and exercise, encourage losing weight     DVT ppx: SQ Lovenox  Code Status: Full code  Family Communication:    Yes, patient's  mother-in law  at bed side.        Disposition Plan:  Anticipate discharge back to previous environment  Consults called:  Dr. Amada Jupiter of neurology. And psychiatry NP, Varney Biles  Admission status and Level of care: Progressive:   for obs   Dispo: The patient is from: Home              Anticipated d/c is to: Home              Anticipated d/c date is: 1 day              Patient currently is not medically stable to d/c.    Severity of Illness:  The appropriate patient status for this patient is OBSERVATION. Observation status is judged to be reasonable and necessary in order to provide the required intensity of service to ensure the patient's safety. The patient's presenting symptoms, physical exam findings, and initial radiographic and laboratory data in the context of  their medical condition is felt to place them at decreased risk for further clinical deterioration. Furthermore, it is anticipated that the patient will be medically stable for discharge from the hospital within 2 midnights of admission.        Date of Service 04/13/2023    Lorretta Harp [facility]   If 7PM-7 tobacco please contact night-coverage [email or web address] 04/13/2023, 2:32 PM

## 2023-04-13 NOTE — ED Provider Notes (Signed)
[facility] Provider Note    Event Date/Time   First MD Initiated Contact with Patient 04/13/23 [phone]     (approximate)   History   Altered Mental Status   HPI  Anne Rogers is a 27 y.o. female reportedly otherwise healthy who comes in with concerns for altered mental status.  Patient was last seen normal on 4/17 at 10:30 PM when boyfriend went to bed.  Boyfriend found patient laying on the couch covered in vomit.  Patient was able to stand up and walk to the kitchen while boyfriend cleaned up the vomit but then patient was found down on the ground again.  Unknown if any alcohol or drug use but none reported by the boyfriend.  There was a question of maybe a seizure but upon further clarification the boyfriend never witnessed any seizure-like activity per EMS report.    Physical Exam   Triage Vital Signs: ED Triage Vitals  Enc Vitals Group     BP      Pulse      Resp      Temp      Temp src      SpO2      Weight      Height      Head Circumference      Peak Flow      Pain Score      Pain Loc      Pain Edu?      Excl. in GC?     Most recent vital signs: Vitals:   04/13/23 0930 04/13/23 1003  BP: (!) 105/58   Resp:    Temp:  98.5 F (36.9 C)     General: Awake, no distress.  Patient is looking around the room.  She withdraws to pain in both legs.  Moves her head spontaneously around. CV:  Good peripheral perfusion.  Resp:  Normal effort.  Abd:  No distention.  Soft nontender Other:     ED Results / Procedures / Treatments   Labs (all labs ordered are listed, but only abnormal results are displayed) Labs Reviewed  CBC WITH DIFFERENTIAL/PLATELET  COMPREHENSIVE METABOLIC PANEL  LIPASE, BLOOD  HCG, QUANTITATIVE, PREGNANCY  URINALYSIS, ROUTINE W REFLEX MICROSCOPIC  URINE DRUG SCREEN, QUALITATIVE ([facility] ONLY)  SALICYLATE LEVEL  ACETAMINOPHEN LEVEL  CK  ETHANOL     EKG  My interpretation of EKG:  Sinus tachycardia rate  of 119 without any ST elevation or T wave inversions, QTc of 506  Repeat EKG sinus tachycardia rate of 1 1 without any ST elevation or T wave inversions, QTc 44  RADIOLOGY I have reviewed the xray personally and interpreted and  evidence of pulmonary crowding  PROCEDURES:  Critical Care performed: No  .1-3 Lead EKG Interpretation  Performed by: Concha Se, MD Authorized by: Concha Se, MD     Interpretation: abnormal     ECG rate:  100   ECG rate assessment: tachycardic     Rhythm: sinus tachycardia     Ectopy: none     Conduction: normal      MEDICATIONS ORDERED IN ED: Medications  iohexol (OMNIPAQUE) 350 MG/ML injection 75 mL (has no administration in time range)  sodium chloride 0.9 % bolus 1,000 mL (1,000 mLs Intravenous New Bag/Given 04/13/23 0810)  ondansetron (ZOFRAN) injection 4 mg (4 mg Intravenous Given 04/13/23 0927)     IMPRESSION / MDM / ASSESSMENT AND PLAN / ED COURSE  I reviewed the triage vital  signs and the nursing notes.   Patient's presentation is most consistent with acute presentation with potential threat to life or bodily function.   Patient comes in with altered mental status.  Differential is broad including Electra abnormalities, drugs, overdose, EtOH abuse, intracranial hemorrhage, seizures and patient being postictal no prior history of seizures.  Will start off with blood work, CT imaging will give some fluids for tachycardia and reassess patient.  Code was not called given last known normal out of the window and patient is moving both legs spontaneously so seems less likely like LVO  8:51 AM there was a report from the dad she had sent goodbye text messages so unclear if this was potentially a suicide attempt.  9:24 AM Reevaluated patient she is able to squeeze bilateral hands moving bilateral legs she is stating some things but just seems very confused things are not really quite making sense.  Will get a CTA just to ensure no LVO,  dissection.  Abdomen soft nontender.  She denies any shortness of breath.  Magnesium is normal CBC reassuring CMP reassuring lipase normal pregnancy test negative salicylate negative Tylenol negative  12:07 PM patient CTA is negative.  She is following commands and moving extremities but still seems confused discussed with Dr. Amada Jupiter from neurology recommended EEG but okay to admit patient here.  She was cocaine positive which could be contributing.  At this point she has not made any attempts to leave the hospital we do not know for sure if this was a suicidal attempt and she has been staying voluntary but if she did try to leave I do not think like she would have capacity to at this point.  But I will put in a psych consult just in case while patient is admitted.  Her boyfriend is remaining at bedside at this time.  I will discuss with hospital team for admission  The patient is on the cardiac monitor to evaluate for evidence of arrhythmia and/or significant heart rate changes.      FINAL CLINICAL IMPRESSION(S) / ED DIAGNOSES   Final diagnoses:  Altered mental status, unspecified altered mental status type  Cocaine use     Rx / DC Orders   ED Discharge Orders     None        Note:  This document was prepared using Dragon voice recognition software and may include unintentional dictation errors.   Concha Se, MD 04/13/23 [phone]

## 2023-04-13 NOTE — Consult Note (Addendum)
Neurology Consultation Reason for Consult: Seizure Referring Physician: Jarvis Morgan  CC: Seizure  History is obtained from: Patient, father  HPI: Anne Rogers is a 27 y.o. female who states that she has been doing cocaine for about a month and a half now.  She apparently got sick this morning, and then stood and fell back and was speaking nonsensically.  There is question of possible seizure activity but not definite.  She then vomited again, it is noted that it was "very pink."  There had also been some question of textss concerning for possible intentional ingestion.  The patient has been steadily improving over the course of the day, and is able to do to admit to me that she does use cocaine, and has been using it for about a month and a half.  She last used it yesterday.  She denies taking too much of anything, but is still somnolent and I am not sure of the reliability of her answers.   Past Medical History:  Diagnosis Date   Anxiety    Depression    Gall stones      Family History  Problem Relation Age of Onset   Cancer Mother    Cancer Father      Social History:  reports that she has been smoking cigarettes. She has a 0.38 pack-year smoking history. She has never used smokeless tobacco. She reports that she does not currently use alcohol. She reports current drug use. Drug: "Crack" cocaine.   Exam: Current vital signs: BP (!) 101/57 (BP Location: Left Arm)   Pulse 71   Temp 98.3 F (36.8 C) (Oral)   Resp 19   Ht  (1.6 m)   Wt (!) 138.3 kg   SpO2 100%   BMI 54.01 kg/m  Vital signs in last 24 hours: Temp:  [98.3 F (36.8 C)-98.5 F (36.9 C)] 98.3 F (36.8 C) (04/18 2021) Pulse Rate:  [71-96] 71 (04/18 2021) Resp:  [19-28] 19 (04/18 2021) BP: (101-116)/(57-86) 101/57 (04/18 2021) SpO2:  [92 %-100 %] 100 % (04/18 2021) Weight:  [138.3 kg] 138.3 kg (04/18 1341)   Physical Exam  Appears well-developed and well-nourished.   Neuro: Mental  Status: Somnolent but easily arousable, she is able to answer questions, though she drifts off and stops answering at times, but not true behavioral arrest. Cranial Nerves: II: Visual Fields are full. Pupils are equal, round, and reactive to light.   III,IV, VI: EOMI without ptosis or diploplia.  V: Facial sensation is symmetric to temperature VII: Facial movement is symmetric.  VIII: hearing is intact to voice X: Uvula elevates symmetrically XI: Shoulder shrug is symmetric. XII: tongue is midline without atrophy or fasciculations.  Motor: Tone is normal. Bulk is normal. 5/5 strength was present in all four extremities.  Sensory: Sensation is symmetric to light touch and temperature in the arms and legs. Cerebellar: FNF and HKS are intact bilaterally   I have reviewed labs in epic and the results pertinent to this consultation are: UDS positive for tricyclics and cocaine  I have reviewed the images obtained: CT/CTA-negative  Impression: 27 year old female with concern for possible ingestion.  Even if this did represent seizure, in the setting of recent cocaine use, this would be considered a provoked seizure.  With negative EEG, would not favor antiepileptic therapy unless she were to continue to have seizures.  Recommendations: 1) EEG was initially recommended, performed at the time of finalizing this note and is negative 2) continue to  monitor clinically 3) ammonia, tsh 4) agree with psych consultation  Ritta Slot, MD [facility] [phone]  If 7pm- 7am, please page neurology on call as listed in AMION.

## 2023-04-13 NOTE — Progress Notes (Signed)
Eeg done 

## 2023-04-14 DIAGNOSIS — F1490 Cocaine use, unspecified, uncomplicated: Secondary | ICD-10-CM | POA: Diagnosis not present

## 2023-04-14 DIAGNOSIS — G9341 Metabolic encephalopathy: Secondary | ICD-10-CM | POA: Diagnosis not present

## 2023-04-14 DIAGNOSIS — T50901A Poisoning by unspecified drugs, medicaments and biological substances, accidental (unintentional), initial encounter: Secondary | ICD-10-CM | POA: Diagnosis present

## 2023-04-14 DIAGNOSIS — R4182 Altered mental status, unspecified: Secondary | ICD-10-CM | POA: Diagnosis not present

## 2023-04-14 DIAGNOSIS — F1410 Cocaine abuse, uncomplicated: Secondary | ICD-10-CM | POA: Diagnosis present

## 2023-04-14 LAB — HIV ANTIBODY (ROUTINE TESTING W REFLEX): HIV Screen 4th Generation wRfx: NONREACTIVE

## 2023-04-14 MED ORDER — NICOTINE 21 MG/24HR TD PT24: 21.0000 mg | MEDICATED_PATCH | Freq: Every day | TRANSDERMAL | 0 refills | Status: AC

## 2023-04-14 NOTE — Discharge Instructions (Signed)
[facility] ?Mental health service in [place], [place] ?Address: [address], [place], [place] [address] ?Hours:  ?Open ? Closes 5?PM ?Phone: [phone] ?

## 2023-04-14 NOTE — Discharge Summary (Signed)
Physician Discharge Summary   Patient: Anne Rogers MRN: [id number] DOB: 15-Oct-1996  Admit date:     04/13/2023  Discharge date: 04/14/23  Discharge Physician: Arnetha Courser   PCP: Lorretta Harp, MD   Recommendations at discharge:  Please obtain CBC and BMP in 1 week Follow-up with primary care provider Follow-up with [facility]  Discharge Diagnoses: Principal Problem:   Overdose Active Problems:   Acute metabolic encephalopathy   Depression with anxiety   Hypokalemia   Morbid obesity   Cocaine abuse   Altered mental status   Cocaine use  Resolved Problems:   Polysubstance abuse  Hospital Course: From H&P.  Anne Rogers is a 27 y.o. female with medical history significant of cocaine abuse, tobacco abuse, depression with anxiety, morbid obesity, gallstone, who presents with altered mental status.   Patient has AMS,  and is unable to provide accurate medical history, therefore, most of the history is obtained by discussing the case with ED physician, per EMS report, and with the nursing staff. Her mother-in law is at bedside, who dose not know what has happened exactly. Per report, pt was last known normal at about 10:30 PM yesterday. Her boyfriend found pt on couch with confusion, covered in vomit this AM. Her boyfriend was able to wake her up. Pt was talking, but she was not making any sense. Patient was walking and fell back. He called EMS.  There was also concern of seizure-like activity per EMS report.  Neurology and psychiatry was consulted.    Data reviewed independently and ED Course: pt was found to have WBC 9.5, positive UDS for cocaine and TCA, negative pregnancy test, CK level 60, urinalysis negative, negative PCR for COVID, GFR> 60, potassium 3.4, temperature normal, blood pressure 105/72, heart rate 96, RR 28, oxygen saturation 96% on room air.   CTA of head and neck was unremarkable, did show mildly enlarged main pulmonary artery, suggestive of mild pulmonary  hypertension. CT head and neck was also nonacute. Chest x-ray with prominent bilateral opacities which could represent bronchovascular crowding in the setting of low lung volume or mild pulmonary edema. EKG:  Sinus rhythm, QTc 484, LAE, poor R wave progression .  4/19: Vital stable.  EEG negative for any seizure-like activity.  Neurology evaluated her and does not think that she has any seizure disorder and will not recommend any medications until she started getting more seizures. TSH and ammonia levels are within normal limit. Psych evaluated her and cleared her for discharge.  She most likely had unintentional cocaine overdose.  Her mentation improved and she is now close to her baseline.  No more nausea and vomiting.  She is able to tolerate diet.  She was provided with resources for [facility] which she should follow-up as outpatient so they can help her stay sober and away from illicit drugs.  Patient will continue her home medications and need to have a close follow-up with her providers for further recommendations.      Consultants: Neurology.  Psychiatry Procedures performed: EEG Disposition: Home Diet recommendation:  Discharge Diet Orders (From admission, onward)     Start     Ordered   04/14/23 0000  Diet - low sodium heart healthy        04/14/23 1317           Regular diet DISCHARGE MEDICATION: Allergies as of 04/14/2023   No Known Allergies      Medication List     STOP taking these medications  gabapentin 100 MG capsule Commonly known as: NEURONTIN   gabapentin 300 MG capsule Commonly known as: NEURONTIN   hydrOXYzine 10 MG tablet Commonly known as: ATARAX   LORazepam 0.5 MG tablet Commonly known as: ATIVAN   norethindrone 0.35 MG tablet Commonly known as: MICRONOR   Victoza 18 MG/3ML Sopn Generic drug: liraglutide       TAKE these medications    buPROPion 150 MG 24 hr tablet Commonly known as: WELLBUTRIN XL Take 150 mg by mouth daily.    nicotine 21 mg/24hr patch Commonly known as: NICODERM CQ - dosed in mg/24 hours Place 1 patch (21 mg total) onto the skin daily. Start taking on: April 15, 2023   pantoprazole 40 MG tablet Commonly known as: PROTONIX Take 40 mg by mouth daily.   sertraline 100 MG tablet Commonly known as: Zoloft Take 1 tablet (100 mg total) by mouth daily.   Sprintec 28 0.25-35 MG-MCG tablet Generic drug: norgestimate-ethinyl estradiol Take 1 tablet by mouth daily.        Follow-up Information     Lorretta Harp, MD .   Specialty: Internal Medicine Contact information: [address] [place] [address] [phone]                Discharge Exam: Ceasar Mons Weights   2023-04-26 1341  Weight: (!) 138.3 kg   General.  Obese lady, in no acute distress. Pulmonary.  Lungs clear bilaterally, normal respiratory effort. CV.  Regular rate and rhythm, no JVD, rub or murmur. Abdomen.  Soft, nontender, nondistended, BS positive. CNS.  Alert and oriented .  No focal neurologic deficit. Extremities.  No edema, no cyanosis, pulses intact and symmetrical. Psychiatry.  Judgment and insight appears normal.   Condition at discharge: stable  The results of significant diagnostics from this hospitalization (including imaging, microbiology, ancillary and laboratory) are listed below for reference.   Imaging Studies: EEG adult  Result Date: Apr 26, 2023 Rejeana Brock, MD     04/26/2023  8:37 PM History: 27 year old female being evaluated for possible seizure Sedation: None Technique: This EEG was acquired with electrodes placed according to the International 10-20 electrode system (including Fp1, Fp2, F3, F4, C3, C4, P3, P4, O1, O2, T3, T4, T5, T6, A1, A2, Fz, Cz, Pz). The following electrodes were missing or displaced: none. Background: The vast majority of this EEG was performed during sleep with frequent K complexes.  These are symmetric in distribution.  With brief arousal, there is a  clearly delineated well-formed posterior dominant rhythm of 10 Hz seen. Photic stimulation: Physiologic driving is not performed EEG Abnormalities: None Clinical Interpretation: This normal EEG is recorded in the waking and sleep state. There was no seizure or seizure predisposition recorded on this study. Please note that lack of epileptiform activity on EEG does not preclude the possibility of epilepsy. Ritta Slot, MD [facility] [phone] If 7pm- 7am, please page neurology on call as listed in AMION.   CT ANGIO HEAD NECK W WO CM  Result Date: 04-26-23 CLINICAL DATA:  Neuro deficit, acute, stroke suspected. EXAM: CT ANGIOGRAPHY HEAD AND NECK WITH AND WITHOUT CONTRAST TECHNIQUE: Multidetector CT imaging of the head and neck was performed using the standard protocol during bolus administration of intravenous contrast. Multiplanar CT image reconstructions and MIPs were obtained to evaluate the vascular anatomy. Carotid stenosis measurements (when applicable) are obtained utilizing NASCET criteria, using the distal internal carotid diameter as the denominator. RADIATION DOSE REDUCTION: This exam was performed according to the departmental  dose-optimization program which includes automated exposure control, adjustment of the mA and/or kV according to patient size and/or use of iterative reconstruction technique. CONTRAST:  75mL OMNIPAQUE IOHEXOL 350 MG/ML SOLN COMPARISON:  Head CT 04/13/2023. FINDINGS: CTA NECK FINDINGS Aortic arch: Three-vessel arch configuration. Arch vessel origins are patent. Right carotid system: No evidence of dissection, stenosis (50% or greater), or occlusion. Left carotid system: No evidence of dissection, stenosis (50% or greater), or occlusion. Vertebral arteries: Codominant. No evidence of dissection, stenosis (50% or greater), or occlusion. Skeleton: Normal. Other neck: Unremarkable. Upper chest: Mildly enlarged main pulmonary artery, measuring up to 3.0 cm  (image 317 series 8) and suggestive of pulmonary hypertension. Review of the MIP images confirms the above findings CTA HEAD FINDINGS Anterior circulation: Intracranial ICAs are patent without stenosis or aneurysm. The proximal ACAs and MCAs are patent without stenosis or aneurysm. Distal branches are symmetric. Posterior circulation: Normal basilar artery. The SCAs, AICAs and PICAs are patent proximally. The PCAs are patent proximally without stenosis or aneurysm. Distal branches are symmetric. Venous sinuses: As permitted by contrast timing, patent. Anatomic variants: None. Review of the MIP images confirms the above findings IMPRESSION: 1. Unremarkable CTA of the head and neck. 2. Mildly enlarged main pulmonary artery, suggestive of pulmonary hypertension. Electronically Signed   By: Orvan Falconer M.D.   On: 04/13/2023 11:07   CT HEAD WO CONTRAST ( )  Result Date: 04/13/2023 CLINICAL DATA:  Cervical trauma EXAM: CT HEAD WITHOUT CONTRAST CT CERVICAL SPINE WITHOUT CONTRAST TECHNIQUE: Multidetector CT imaging of the head and cervical spine was performed following the standard protocol without intravenous contrast. Multiplanar CT image reconstructions of the cervical spine were also generated. RADIATION DOSE REDUCTION: This exam was performed according to the departmental dose-optimization program which includes automated exposure control, adjustment of the mA and/or kV according to patient size and/or use of iterative reconstruction technique. COMPARISON:  None Available. FINDINGS: CT HEAD FINDINGS Brain: No evidence of acute infarction, hemorrhage, hydrocephalus, extra-axial collection or mass lesion/mass effect. Vascular: No hyperdense vessel or unexpected calcification. Skull: Normal. Negative for fracture or focal lesion. Sinuses/Orbits: No middle ear or mastoid effusion. Paranasal sinuses are notable for polypoid mucosal thickening left maxillary sinus. Orbits are unremarkable. Other: None. CT CERVICAL  SPINE FINDINGS Alignment: Straightening of the normal cervical lordosis. Skull base and vertebrae: No acute fracture. No primary bone lesion or focal pathologic process. Soft tissues and spinal canal: No prevertebral fluid or swelling. No visible canal hematoma. Disc levels:  No evidence of high-grade spinal canal stenosis. Upper chest: Biapical ground-glass opacities, nonspecific, likely infectious or inflammatory. Other: None IMPRESSION: 1. No acute intracranial abnormality. 2. No acute fracture or subluxation of the cervical spine. 3. Biapical ground-glass opacities, nonspecific, likely due to expiratory phase of imaging. Electronically Signed   By: Lorenza Cambridge M.D.   On: 04/13/2023 08:49   CT Cervical Spine Wo Contrast  Result Date: 04/13/2023 CLINICAL DATA:  Cervical trauma EXAM: CT HEAD WITHOUT CONTRAST CT CERVICAL SPINE WITHOUT CONTRAST TECHNIQUE: Multidetector CT imaging of the head and cervical spine was performed following the standard protocol without intravenous contrast. Multiplanar CT image reconstructions of the cervical spine were also generated. RADIATION DOSE REDUCTION: This exam was performed according to the departmental dose-optimization program which includes automated exposure control, adjustment of the mA and/or kV according to patient size and/or use of iterative reconstruction technique. COMPARISON:  None Available. FINDINGS: CT HEAD FINDINGS Brain: No evidence of acute infarction, hemorrhage, hydrocephalus, extra-axial collection or mass lesion/mass effect. Vascular:  No hyperdense vessel or unexpected calcification. Skull: Normal. Negative for fracture or focal lesion. Sinuses/Orbits: No middle ear or mastoid effusion. Paranasal sinuses are notable for polypoid mucosal thickening left maxillary sinus. Orbits are unremarkable. Other: None. CT CERVICAL SPINE FINDINGS Alignment: Straightening of the normal cervical lordosis. Skull base and vertebrae: No acute fracture. No primary bone  lesion or focal pathologic process. Soft tissues and spinal canal: No prevertebral fluid or swelling. No visible canal hematoma. Disc levels:  No evidence of high-grade spinal canal stenosis. Upper chest: Biapical ground-glass opacities, nonspecific, likely infectious or inflammatory. Other: None IMPRESSION: 1. No acute intracranial abnormality. 2. No acute fracture or subluxation of the cervical spine. 3. Biapical ground-glass opacities, nonspecific, likely due to expiratory phase of imaging. Electronically Signed   By: Lorenza Cambridge M.D.   On: 04/13/2023 08:49   DG Chest Portable 1 View  Result Date: 04/13/2023 CLINICAL DATA:  Shortness of breath EXAM: PORTABLE CHEST 1 VIEW COMPARISON:  CXR 10/22/22 FINDINGS: No pleural effusion. No pneumothorax. Low lung volumes. Prominent bilateral opacities could represent bronchovascular crowding in the setting of low lung volumes or mild pulmonary edema. No focal consolidative opacity. No radiographically apparent displaced rib fractures. Visualized upper abdomen is unremarkable. IMPRESSION: Prominent bilateral opacities could represent bronchovascular crowding in the setting of low lung volumes or mild pulmonary edema Electronically Signed   By: Lorenza Cambridge M.D.   On: 04/13/2023 08:22    Microbiology: Results for orders placed or performed during the hospital encounter of 04/13/23  SARS Coronavirus 2 by RT PCR (hospital order, performed in [facility] hospital lab) *cepheid single result test* Anterior Nasal Swab     Status: None   Collection Time: 04/13/23 10:36 AM   Specimen: Anterior Nasal Swab  Result Value Ref Range Status   SARS Coronavirus 2 by RT PCR NEGATIVE NEGATIVE Final    Comment: (NOTE) SARS-CoV-2 target nucleic acids are NOT DETECTED.  The SARS-CoV-2 RNA is generally detectable in upper and lower respiratory specimens during the acute phase of infection. The lowest concentration of SARS-CoV-2 viral copies this assay can detect is  250 copies / mL. A negative result does not preclude SARS-CoV-2 infection and should not be used as the sole basis for treatment or other patient management decisions.  A negative result may occur with improper specimen collection / handling, submission of specimen other than nasopharyngeal swab, presence of viral mutation(s) within the areas targeted by this assay, and inadequate number of viral copies (<250 copies / mL). A negative result must be combined with clinical observations, patient history, and epidemiological information.  Fact Sheet for Patients:   RoadLapTop.co.za  Fact Sheet for Healthcare Providers: http://kim-miller.com/  This test is not yet approved or  cleared by the [place] FDA and has been authorized for detection and/or diagnosis of SARS-CoV-2 by FDA under an Emergency Use Authorization (EUA).  This EUA will remain in effect (meaning this test can be used) for the duration of the COVID-19 declaration under Section 564(b)(1) of the Act, 21 U.S.C. section 360bbb-3(b)(1), unless the authorization is terminated or revoked sooner.  Performed at [facility], [address] [place], [place] [address]     Labs: CBC: Recent Labs  Lab 04/13/23 0804  WBC 9.5  NEUTROABS 7.3  HGB 14.3  HCT 42.7  MCV 88.0  PLT 311   Basic Metabolic Panel: Recent Labs  Lab 04/13/23 0804  NA 142  K 3.4*  CL 107  CO2 23  GLUCOSE 91  BUN  11  CREATININE 0.94  CALCIUM 8.8*  MG 2.2   Liver Function Tests: Recent Labs  Lab 04/13/23 0804  AST 34  ALT 30  ALKPHOS 52  BILITOT 0.3  PROT 7.2  ALBUMIN 3.5   CBG: No results for input(s): "GLUCAP" in the last 168 hours.  Discharge time spent: greater than 30 minutes.  This record has been created using Conservation officer, historic buildings. Errors have been sought and corrected,but may not always be located. Such creation errors do not reflect on the standard of  care.   Signed: Arnetha Courser, MD [facility] 04/14/2023

## 2023-04-14 NOTE — Consult Note (Signed)
[facility] Face-to-Face Psychiatry Consult   Reason for Consult:  overdose on cocaine, unintentional Referring Physician:  Dr Fuller Plan Patient Identification: [name] MRN:  [id number] Principal Diagnosis: Overdose Diagnosis:  Principal Problem:   Overdose Active Problems:   Acute metabolic encephalopathy   Morbid obesity   Depression with anxiety   Hypokalemia   Cocaine abuse   Total Time spent with patient: 45 minutes  Subjective:   Drinda SAMYUKTHA BRAU is a 27 y.o. female patient admitted with overdose on cocaine, unintentional..  HPI:  27 yo female presented to the ED after her boyfriend found her on the couch with vomit and unresponsiveness.  She was medically cleared. On assessment, she reported moderate depression with no suicidal ideations or past attempts or hospitalizations, denies owning guns or access in the home.  She reports using "a little cocaine" with no memory of what happened after she laid on the couch to watch television.  Denies suicide attempt.  Moderate anxiety with no panic attacks.   Vapes nicotine, no other substance use.  She denies hallucinations, paranoia, homicidal ideations, and withdrawal symptoms.  Minimizes her use and not interested in therapy, resources in discharge instructions.  Permission obtained to speak to her father who was waiting in the hallway.  He did not have safety concerns and confirmed no weapons in the home where she lives with her boyfriend and 2 children.  He was upset about discovering her substance use prior to the team meeting with him.  Evidently, Anne Rogers is close with her ex-mother-in-law who sells cocaine per the father.  Client feels safe returning home, encouraged to follow up with [facility].  Past Psychiatric History: postpartum depression  Risk to Self:  none Risk to Others:  none Prior Inpatient Therapy:  none Prior Outpatient Therapy:  none  Past Medical History:  Past Medical History:  Diagnosis Date   Anxiety    Depression     Gall stones     Past Surgical History:  Procedure Laterality Date   ABDOMINAL SURGERY     INDUCED ABORTION  02/2015   INDUCED ABORTION     x 2   INDUCED ABORTION     x 3   Family History:  Family History  Problem Relation Age of Onset   Cancer Mother    Cancer Father    Family Psychiatric  History: none Social History:  Social History   Substance and Sexual Activity  Alcohol Use Not Currently     Social History   Substance and Sexual Activity  Drug Use Yes   Types: "Crack" cocaine    Social History   Socioeconomic History   Marital status: Media planner    Spouse name: [name]   Number of children: 1   Years of education: Not on file   Highest education level: Not on file  Occupational History   Occupation: Unemployed  Tobacco Use   Smoking status: Some Days    Packs/day: 0.25    Years: 1.50    Additional pack years: 0.00    Total pack years: 0.38    Types: Cigarettes   Smokeless tobacco: Never  Vaping Use   Vaping Use: Every day  Substance and Sexual Activity   Alcohol use: Not Currently   Drug use: Yes    Types: "Crack" cocaine   Sexual activity: Yes    Birth control/protection: None, Condom  Other Topics Concern   Not on file  Social History Narrative   Does not want [name] Here for delivery  Social Determinants of Health   Financial Resource Strain: Not on file  Food Insecurity: No Food Insecurity (04/13/2023)   Hunger Vital Sign    Worried About Running Out of Food in the Last Year: Never true    Ran Out of Food in the Last Year: Never true  Transportation Needs: No Transportation Needs (04/13/2023)   PRAPARE - Administrator, Civil Service (Medical): No    Lack of Transportation (Non-Medical): No  Physical Activity: Not on file  Stress: Not on file  Social Connections: Not on file   Additional Social History:    Allergies:  No Known Allergies  Labs:  Results for orders placed or performed during the hospital encounter  of 04/13/23 (from the past 48 hour(s))  Brain natriuretic peptide     Status: None   Collection Time: 04/13/23  8:03 AM  Result Value Ref Range   B Natriuretic Peptide 9.4 0.0 - 100.0 pg/mL    Comment: Performed at [facility], [address], [place], [place] [address]  Troponin I (High Sensitivity)     Status: None   Collection Time: 04/13/23  8:03 AM  Result Value Ref Range   Troponin I (High Sensitivity) <2 <18 ng/L    Comment: (NOTE) Elevated high sensitivity troponin I (hsTnI) values and significant  changes across serial measurements may suggest ACS but many other  chronic and acute conditions are known to elevate hsTnI results.  Refer to the "Links" section for chest pain algorithms and additional  guidance. Performed at [facility], [address] [place], [place] [address]   CBC with Differential     Status: None   Collection Time: 04/13/23  8:04 AM  Result Value Ref Range   WBC 9.5 4.0 - 10.5 K/uL   RBC 4.85 3.87 - 5.11 MIL/uL   Hemoglobin 14.3 12.0 - 15.0 g/dL   HCT 91.4 78.2 - 95.6 %   MCV 88.0 80.0 - 100.0 fL   MCH 29.5 26.0 - 34.0 pg   MCHC 33.5 30.0 - 36.0 g/dL   RDW 21.3 08.6 - 57.8 %   Platelets 311 150 - 400 K/uL   nRBC 0.0 0.0 - 0.2 %   Neutrophils Relative % 78 %   Neutro Abs 7.3 1.7 - 7.7 K/uL   Lymphocytes Relative 16 %   Lymphs Abs 1.5 0.7 - 4.0 K/uL   Monocytes Relative 6 %   Monocytes Absolute 0.6 0.1 - 1.0 K/uL   Eosinophils Relative 0 %   Eosinophils Absolute 0.0 0.0 - 0.5 K/uL   Basophils Relative 0 %   Basophils Absolute 0.0 0.0 - 0.1 K/uL   Immature Granulocytes 0 %   Abs Immature Granulocytes 0.04 0.00 - 0.07 K/uL    Comment: Performed at [facility], [address] [place], [place] [address]  Comprehensive metabolic panel     Status: Abnormal   Collection Time: 04/13/23  8:04 AM  Result Value Ref Range   Sodium 142 135 - 145 mmol/L   Potassium 3.4 (L) 3.5 - 5.1 mmol/L   Chloride 107 98 - 111 mmol/L    CO2 23 22 - 32 mmol/L   Glucose, Bld 91 70 - 99 mg/dL    Comment: Glucose reference range applies only to samples taken after fasting for at least 8 hours.   BUN 11 6 - 20 mg/dL   Creatinine, Ser 9.52 0.44 - 1.00 mg/dL   Calcium 8.8 (L) 8.9 - 10.3 mg/dL  Total Protein 7.2 6.5 - 8.1 g/dL   Albumin 3.5 3.5 - 5.0 g/dL   AST 34 15 - 41 U/L   ALT 30 0 - 44 U/L   Alkaline Phosphatase 52 38 - 126 U/L   Total Bilirubin 0.3 0.3 - 1.2 mg/dL   GFR, Estimated >16 >10 mL/min    Comment: (NOTE) Calculated using the CKD-EPI Creatinine Equation (2021)    Anion gap 12 5 - 15    Comment: Performed at [facility], [address] [place], [place] [address]  Lipase, blood     Status: None   Collection Time: 04/13/23  8:04 AM  Result Value Ref Range   Lipase 27 11 - 51 U/L    Comment: Performed at [facility], [address] [place], [place] [address]  hCG, quantitative, pregnancy     Status: None   Collection Time: 04/13/23  8:04 AM  Result Value Ref Range   hCG, Beta Chain, Quant, S <1 <5 mIU/mL    Comment:          GEST. AGE      CONC.  (mIU/mL)   <=1 WEEK        5 - 50     2 WEEKS       50 - 500     3 WEEKS       100 - 10,000     4 WEEKS     1,000 - 30,000     5 WEEKS     3,500 - 115,000   6-8 WEEKS     12,000 - 270,000    12 WEEKS     15,000 - 220,000        FEMALE AND NON-PREGNANT FEMALE:     LESS THAN 5 mIU/mL Performed at [facility], [address] [place], [place] [address]   Urinalysis, Routine w reflex microscopic -Urine, Clean Catch     Status: Abnormal   Collection Time: 04/13/23  8:04 AM  Result Value Ref Range   Color, Urine STRAW (A) YELLOW   APPearance HAZY (A) CLEAR   Specific Gravity, Urine 1.032 (H) 1.005 - 1.030   pH 7.0 5.0 - 8.0   Glucose, UA NEGATIVE NEGATIVE mg/dL   Hgb urine dipstick MODERATE (A) NEGATIVE   Bilirubin Urine NEGATIVE NEGATIVE   Ketones, ur NEGATIVE NEGATIVE mg/dL   Protein, ur NEGATIVE NEGATIVE mg/dL    Nitrite NEGATIVE NEGATIVE   Leukocytes,Ua TRACE (A) NEGATIVE   RBC / HPF >50 0 - 5 RBC/hpf   WBC, UA 0-5 0 - 5 WBC/hpf   Bacteria, UA NONE SEEN NONE SEEN   Squamous Epithelial / HPF 0-5 0 - 5 /HPF   Mucus PRESENT    Hyaline Casts, UA PRESENT    Amorphous Crystal PRESENT     Comment: Performed at [facility], [address], [place], [place] [address]  Urine Drug Screen, Qualitative ([facility] only)     Status: Abnormal   Collection Time: 04/13/23  8:04 AM  Result Value Ref Range   Tricyclic, Ur Screen POSITIVE (A) NONE DETECTED   Amphetamines, Ur Screen NONE DETECTED NONE DETECTED   MDMA (Ecstasy)Ur Screen NONE DETECTED NONE DETECTED   Cocaine Metabolite,Ur [place] POSITIVE (A) NONE DETECTED   Opiate, Ur Screen NONE DETECTED NONE DETECTED   Phencyclidine (PCP) Ur S NONE DETECTED NONE DETECTED   Cannabinoid 50 Ng, Ur [employer] NONE DETECTED NONE DETECTED   Barbiturates, Ur Screen NONE DETECTED NONE DETECTED  Benzodiazepine, Ur Scrn NONE DETECTED NONE DETECTED   Methadone Scn, Ur NONE DETECTED NONE DETECTED    Comment: (NOTE) Tricyclics + metabolites, urine    Cutoff 1000 ng/mL Amphetamines + metabolites, urine  Cutoff 1000 ng/mL MDMA (Ecstasy), urine              Cutoff 500 ng/mL Cocaine Metabolite, urine          Cutoff 300 ng/mL Opiate + metabolites, urine        Cutoff 300 ng/mL Phencyclidine (PCP), urine         Cutoff 25 ng/mL Cannabinoid, urine                 Cutoff 50 ng/mL Barbiturates + metabolites, urine  Cutoff 200 ng/mL Benzodiazepine, urine              Cutoff 200 ng/mL Methadone, urine                   Cutoff 300 ng/mL  The urine drug screen provides only a preliminary, unconfirmed analytical test result and should not be used for non-medical purposes. Clinical consideration and professional judgment should be applied to any positive drug screen result due to possible interfering substances. A more specific alternate chemical method must be used in order to obtain a  confirmed analytical result. Gas chromatography / mass spectrometry (GC/MS) is the preferred confirm atory method. Performed at [facility], [address] [place], [place] [address]   Salicylate level     Status: Abnormal   Collection Time: 04/13/23  8:04 AM  Result Value Ref Range   Salicylate Lvl <7.0 (L) 7.0 - 30.0 mg/dL    Comment: Performed at [facility], [address] [place], [place] [address]  Acetaminophen level     Status: Abnormal   Collection Time: 04/13/23  8:04 AM  Result Value Ref Range   Acetaminophen (Tylenol), Serum <10 (L) 10 - 30 ug/mL    Comment: (NOTE) Therapeutic concentrations vary significantly. A range of 10-30 ug/mL  may be an effective concentration for many patients. However, some  are best treated at concentrations outside of this range. Acetaminophen concentrations >150 ug/mL at 4 hours after ingestion  and >50 ug/mL at 12 hours after ingestion are often associated with  toxic reactions.  Performed at [facility], [address] [place], [place] [address]   CK     Status: None   Collection Time: 04/13/23  8:04 AM  Result Value Ref Range   Total CK 60 38 - 234 U/L    Comment: Performed at [facility], [address] [place], [place] [address]  Ethanol     Status: None   Collection Time: 04/13/23  8:04 AM  Result Value Ref Range   Alcohol, Ethyl (B) <10 <10 mg/dL    Comment: (NOTE) Lowest detectable limit for serum alcohol is 10 mg/dL.  For medical purposes only. Performed at [facility], [address] [place], [place] [address]   Magnesium     Status: None   Collection Time: 04/13/23  8:04 AM  Result Value Ref Range   Magnesium 2.2 1.7 - 2.4 mg/dL    Comment: Performed at [facility], [address] [place], [place] [address]  SARS Coronavirus 2 by RT PCR (hospital order, performed in [facility] hospital lab) *cepheid single result test* Anterior Nasal Swab      Status: None   Collection Time: 04/13/23 10:36 AM   Specimen:  Anterior Nasal Swab  Result Value Ref Range   SARS Coronavirus 2 by RT PCR NEGATIVE NEGATIVE    Comment: (NOTE) SARS-CoV-2 target nucleic acids are NOT DETECTED.  The SARS-CoV-2 RNA is generally detectable in upper and lower respiratory specimens during the acute phase of infection. The lowest concentration of SARS-CoV-2 viral copies this assay can detect is 250 copies / mL. A negative result does not preclude SARS-CoV-2 infection and should not be used as the sole basis for treatment or other patient management decisions.  A negative result may occur with improper specimen collection / handling, submission of specimen other than nasopharyngeal swab, presence of viral mutation(s) within the areas targeted by this assay, and inadequate number of viral copies (<250 copies / mL). A negative result must be combined with clinical observations, patient history, and epidemiological information.  Fact Sheet for Patients:   RoadLapTop.co.za  Fact Sheet for Healthcare Providers: http://kim-miller.com/  This test is not yet approved or  cleared by the [place] FDA and has been authorized for detection and/or diagnosis of SARS-CoV-2 by FDA under an Emergency Use Authorization (EUA).  This EUA will remain in effect (meaning this test can be used) for the duration of the COVID-19 declaration under Section 564(b)(1) of the Act, 21 U.S.C. section 360bbb-3(b)(1), unless the authorization is terminated or revoked sooner.  Performed at [facility], [address] [place], [place] [address]   Acetaminophen level     Status: Abnormal   Collection Time: 04/13/23 10:36 AM  Result Value Ref Range   Acetaminophen (Tylenol), Serum <10 (L) 10 - 30 ug/mL    Comment: (NOTE) Therapeutic concentrations vary significantly. A range of 10-30 ug/mL  may be an effective concentration for  many patients. However, some  are best treated at concentrations outside of this range. Acetaminophen concentrations >150 ug/mL at 4 hours after ingestion  and >50 ug/mL at 12 hours after ingestion are often associated with  toxic reactions.  Performed at [facility], [address] [place], [place] [address]   HIV Antibody (routine testing w rflx)     Status: None   Collection Time: 04/13/23 10:07 PM  Result Value Ref Range   HIV Screen 4th Generation wRfx Non Reactive Non Reactive    Comment: Performed at [facility], [address] [place], [place] [address]  Ammonia     Status: None   Collection Time: 04/13/23 10:07 PM  Result Value Ref Range   Ammonia 34 9 - 35 umol/L    Comment: Performed at [facility], [address] [place], [place] [address]  TSH     Status: None   Collection Time: 04/13/23 10:07 PM  Result Value Ref Range   TSH 4.065 0.350 - 4.500 uIU/mL    Comment: Performed by a 3rd Generation assay with a functional sensitivity of <=0.01 uIU/mL. Performed at [facility], [address] [place], [place] [address]     Current Facility-Administered Medications  Medication Dose Route Frequency Provider Last Rate Last Admin   acetaminophen (TYLENOL) tablet 650 mg  650 mg Oral Q6H PRN Lorretta Harp, MD       enoxaparin (LOVENOX) injection 70 mg  70 mg Subcutaneous Q24H Lorretta Harp, MD   70 mg at 04/13/23 2149   LORazepam (ATIVAN) injection 1 mg  1 mg Intravenous Q2H PRN Lorretta Harp, MD       nicotine (NICODERM CQ - dosed in mg/24 hours) patch 21 mg  21 mg Transdermal  Daily Lorretta Harp, MD       ondansetron [facility]) injection 4 mg  4 mg Intravenous Q8H PRN Lorretta Harp, MD       pantoprazole (PROTONIX) EC tablet 40 mg  40 mg Oral Daily Lorretta Harp, MD   40 mg at 04/13/23 2148   Current Outpatient Medications  Medication Sig Dispense Refill   buPROPion (WELLBUTRIN XL) 150 MG 24 hr tablet Take 150 mg by mouth daily.     pantoprazole  (PROTONIX) 40 MG tablet Take 40 mg by mouth daily.     gabapentin (NEURONTIN) 100 MG capsule Take 100 mg by mouth at bedtime. (Patient not taking: Reported on 04/13/2023)     gabapentin (NEURONTIN) 300 MG capsule Take 300 mg by mouth at bedtime. (Patient not taking: Reported on 04/13/2023)     hydrOXYzine (ATARAX) 10 MG tablet Take 10 mg by mouth 3 (three) times daily as needed. (Patient not taking: Reported on 04/13/2023)     LORazepam (ATIVAN) 0.5 MG tablet Take 0.5 mg by mouth daily as needed. (Patient not taking: Reported on 04/13/2023)     norethindrone (MICRONOR) 0.35 MG tablet Take 1 tablet (0.35 mg total) by mouth daily. (Patient not taking: Reported on 04/13/2023) 28 tablet 11   sertraline (ZOLOFT) 100 MG tablet Take 1 tablet (100 mg total) by mouth daily. 90 tablet 2   SPRINTEC 28 0.25-35 MG-MCG tablet Take 1 tablet by mouth daily.     VICTOZA 18 MG/3ML SOPN Inject into the skin. (Patient not taking: Reported on 04/13/2023)      Musculoskeletal: Strength & Muscle Tone: within normal limits Gait & Station:  did not witness Patient leans: N/A  Psychiatric Specialty Exam: Physical Exam Vitals and nursing note reviewed.  Constitutional:      Appearance: Normal appearance.  HENT:     Head: Normocephalic.     Nose: Nose normal.  Pulmonary:     Effort: Pulmonary effort is normal.  Musculoskeletal:        General: Normal range of motion.     Cervical back: Normal range of motion.  Neurological:     General: No focal deficit present.     Mental Status: She is alert and oriented to person, place, and time.  Psychiatric:        Attention and Perception: Attention and perception normal.        Mood and Affect: Mood is anxious and depressed.        Speech: Speech normal.        Behavior: Behavior normal. Behavior is cooperative.        Thought Content: Thought content normal.        Cognition and Memory: Cognition and memory normal.        Judgment: Judgment normal.     Review of  Systems  Psychiatric/Behavioral:  Positive for depression and substance abuse. The patient is nervous/anxious.   All other systems reviewed and are negative.   Blood pressure (!) 93/55, pulse 66, temperature 98.8 F (37.1 C), temperature source Oral, resp. rate 16, height 5\' 3"  (1.6 m), weight (!) 138.3 kg, SpO2 99 %, currently breastfeeding.Body mass index is 54.01 kg/m.  General Appearance: Casual  Eye Contact:  Good  Speech:  Normal Rate  Volume:  Normal  Mood:  Anxious and Depressed  Affect:  Congruent  Thought Process:  Coherent  Orientation:  Full (Time, Place, and Person)  Thought Content:  WDL and Logical  Suicidal Thoughts:  No  Homicidal Thoughts:  No  Memory:  Immediate;   Good Recent;   Fair Remote;   Good  Judgement:  Fair  Insight:  Fair  Psychomotor Activity:  Decreased  Concentration:  Concentration: Fair and Attention Span: Fair  Recall:  [employer] of Knowledge:  Fair  Language:  Good  Akathisia:  No  Handed:  Right  AIMS (if indicated):     Assets:  Housing Intimacy Leisure Time Physical Health Resilience Social Support  ADL's:  Intact  Cognition:  WNL  Sleep:        Physical Exam: Physical Exam Vitals and nursing note reviewed.  Constitutional:      Appearance: Normal appearance.  HENT:     Head: Normocephalic.     Nose: Nose normal.  Pulmonary:     Effort: Pulmonary effort is normal.  Musculoskeletal:        General: Normal range of motion.     Cervical back: Normal range of motion.  Neurological:     General: No focal deficit present.     Mental Status: She is alert and oriented to person, place, and time.  Psychiatric:        Attention and Perception: Attention and perception normal.        Mood and Affect: Mood is anxious and depressed.        Speech: Speech normal.        Behavior: Behavior normal. Behavior is cooperative.        Thought Content: Thought content normal.        Cognition and Memory: Cognition and memory normal.         Judgment: Judgment normal.    Review of Systems  Psychiatric/Behavioral:  Positive for depression and substance abuse. The patient is nervous/anxious.   All other systems reviewed and are negative.  Blood pressure (!) 93/55, pulse 66, temperature 98.8 F (37.1 C), temperature source Oral, resp. rate 16, height 5\' 3"  (1.6 m), weight (!) 138.3 kg, SpO2 99 %, currently breastfeeding. Body mass index is 54.01 kg/m.  Treatment Plan Summary: Cocaine overdose, unintentional: [facility] Intensive Outpatient Program recommended  Disposition: No evidence of imminent risk to self or others at present.   Patient does not meet criteria for psychiatric inpatient admission. Supportive therapy provided about ongoing stressors.  Nanine Means, NP 04/14/2023 10:25 AM

## 2023-04-14 NOTE — Progress Notes (Signed)
Subjective: Patient continues to improve, now essentially back to baseline  Exam: Vitals:   04/14/23 0715 04/14/23 0938  BP:  (!) 93/55  Pulse: 70 66  Resp:  16  Temp:  98.8 F (37.1 C)  SpO2: 99% 99%   Gen: In bed, NAD Resp: non-labored breathing, no acute distress Abd: soft, nt  Neuro: MS: Awake, alert, oriented CN: EOMI, visual fields full, face symmetric Motor: No drift  Pertinent Labs: Ammonia and TSH are normal  Impression: 27 year old female with acute confusion/nausea and vomiting.  I continue to suspect that this is most consistent with some type of ingestion, but apparently had no self-harm intent.  She should be advised to stop cocaine.  I think further evaluation at this time is relatively low yield.  I would not start antiepileptics unless she were to have further spells of concern  Recommendations: 1).  Neurology will be available on an as-needed basis, please call with further questions or concerns.  Ritta Slot, MD [facility] [phone]  If 7pm- 7am, please page neurology on call as listed in AMION.

## 2023-04-14 NOTE — Hospital Course (Addendum)
From H&P.  Anne Rogers is a 27 y.o. female with medical history significant of cocaine abuse, tobacco abuse, depression with anxiety, morbid obesity, gallstone, who presents with altered mental status.   Patient has AMS,  and is unable to provide accurate medical history, therefore, most of the history is obtained by discussing the case with ED physician, per EMS report, and with the nursing staff. Her mother-in law is at bedside, who dose not know what has happened exactly. Per report, pt was last known normal at about 10:30 PM yesterday. Her boyfriend found pt on couch with confusion, covered in vomit this AM. Her boyfriend was able to wake her up. Pt was talking, but she was not making any sense. Patient was walking and fell back. He called EMS.  There was also concern of seizure-like activity per EMS report.  Neurology and psychiatry was consulted.    Data reviewed independently and ED Course: pt was found to have WBC 9.5, positive UDS for cocaine and TCA, negative pregnancy test, CK level 60, urinalysis negative, negative PCR for COVID, GFR> 60, potassium 3.4, temperature normal, blood pressure 105/72, heart rate 96, RR 28, oxygen saturation 96% on room air.   CTA of head and neck was unremarkable, did show mildly enlarged main pulmonary artery, suggestive of mild pulmonary hypertension. CT head and neck was also nonacute. Chest x-ray with prominent bilateral opacities which could represent bronchovascular crowding in the setting of low lung volume or mild pulmonary edema. EKG:  Sinus rhythm, QTc 484, LAE, poor R wave progression .  4/19: Vital stable.  EEG negative for any seizure-like activity.  Neurology evaluated her and does not think that she has any seizure disorder and will not recommend any medications until she started getting more seizures. TSH and ammonia levels are within normal limit. Psych evaluated her and cleared her for discharge.  She most likely had unintentional  cocaine overdose.  Her mentation improved and she is now close to her baseline.  No more nausea and vomiting.  She is able to tolerate diet.  She was provided with resources for [facility] which she should follow-up as outpatient so they can help her stay sober and away from illicit drugs.  Patient will continue her home medications and need to have a close follow-up with her providers for further recommendations.
